# Patient Record
Sex: Female | Born: 1940 | Race: Black or African American | Hispanic: No | Smoking: Never smoker
Health system: Southern US, Community
[De-identification: ages and names within clinical notes are randomized; demographics above are authoritative.]

## PROBLEM LIST (undated history)

## (undated) DIAGNOSIS — M8580 Other specified disorders of bone density and structure, unspecified site: Secondary | ICD-10-CM

## (undated) DIAGNOSIS — E785 Hyperlipidemia, unspecified: Secondary | ICD-10-CM

## (undated) DIAGNOSIS — R0600 Dyspnea, unspecified: Secondary | ICD-10-CM

## (undated) DIAGNOSIS — R0609 Other forms of dyspnea: Secondary | ICD-10-CM

## (undated) DIAGNOSIS — I10 Essential (primary) hypertension: Secondary | ICD-10-CM

## (undated) DIAGNOSIS — K219 Gastro-esophageal reflux disease without esophagitis: Secondary | ICD-10-CM

## (undated) HISTORY — DX: Other specified disorders of bone density and structure, unspecified site: M85.80

## (undated) HISTORY — DX: Hyperlipidemia, unspecified: E78.5

## (undated) HISTORY — PX: CATARACT EXTRACTION: SUR2

## (undated) HISTORY — PX: OTHER SURGICAL HISTORY: SHX169

## (undated) HISTORY — DX: Dyspnea, unspecified: R06.00

## (undated) HISTORY — PX: TONSILLECTOMY: SUR1361

## (undated) HISTORY — PX: CHOLECYSTECTOMY: SHX55

## (undated) HISTORY — DX: Gastro-esophageal reflux disease without esophagitis: K21.9

## (undated) HISTORY — DX: Other forms of dyspnea: R06.09

## (undated) HISTORY — PX: EYE SURGERY: SHX253

---

## 1997-01-25 HISTORY — PX: OTHER SURGICAL HISTORY: SHX169

## 2000-12-07 ENCOUNTER — Other Ambulatory Visit: Admission: RE | Admit: 2000-12-07 | Discharge: 2000-12-07 | Payer: Self-pay | Admitting: General Surgery

## 2000-12-13 ENCOUNTER — Encounter: Payer: Self-pay | Admitting: General Surgery

## 2000-12-13 ENCOUNTER — Ambulatory Visit (HOSPITAL_COMMUNITY): Admission: RE | Admit: 2000-12-13 | Discharge: 2000-12-13 | Payer: Self-pay | Admitting: General Surgery

## 2001-06-01 ENCOUNTER — Observation Stay (HOSPITAL_COMMUNITY): Admission: EM | Admit: 2001-06-01 | Discharge: 2001-06-02 | Payer: Self-pay | Admitting: Emergency Medicine

## 2001-06-01 ENCOUNTER — Encounter: Payer: Self-pay | Admitting: Cardiothoracic Surgery

## 2001-06-01 ENCOUNTER — Encounter: Payer: Self-pay | Admitting: Internal Medicine

## 2001-06-05 ENCOUNTER — Ambulatory Visit (HOSPITAL_COMMUNITY): Admission: RE | Admit: 2001-06-05 | Discharge: 2001-06-05 | Payer: Self-pay | Admitting: Orthopedic Surgery

## 2005-04-06 ENCOUNTER — Ambulatory Visit: Payer: Self-pay | Admitting: Internal Medicine

## 2005-04-09 ENCOUNTER — Ambulatory Visit (HOSPITAL_COMMUNITY): Admission: RE | Admit: 2005-04-09 | Discharge: 2005-04-09 | Payer: Self-pay | Admitting: Internal Medicine

## 2005-05-20 ENCOUNTER — Ambulatory Visit (HOSPITAL_COMMUNITY): Admission: RE | Admit: 2005-05-20 | Discharge: 2005-05-20 | Payer: Self-pay | Admitting: Obstetrics and Gynecology

## 2005-05-20 ENCOUNTER — Encounter (INDEPENDENT_AMBULATORY_CARE_PROVIDER_SITE_OTHER): Payer: Self-pay | Admitting: *Deleted

## 2006-03-01 ENCOUNTER — Encounter: Payer: Self-pay | Admitting: Internal Medicine

## 2009-03-13 ENCOUNTER — Ambulatory Visit: Payer: Self-pay | Admitting: Family Medicine

## 2009-03-13 ENCOUNTER — Encounter: Payer: Self-pay | Admitting: Physician Assistant

## 2009-03-13 DIAGNOSIS — B351 Tinea unguium: Secondary | ICD-10-CM

## 2009-03-13 DIAGNOSIS — H6120 Impacted cerumen, unspecified ear: Secondary | ICD-10-CM

## 2009-03-13 DIAGNOSIS — D1739 Benign lipomatous neoplasm of skin and subcutaneous tissue of other sites: Secondary | ICD-10-CM | POA: Insufficient documentation

## 2009-03-13 DIAGNOSIS — R0989 Other specified symptoms and signs involving the circulatory and respiratory systems: Secondary | ICD-10-CM

## 2009-03-13 DIAGNOSIS — R0609 Other forms of dyspnea: Secondary | ICD-10-CM

## 2009-03-21 ENCOUNTER — Encounter (INDEPENDENT_AMBULATORY_CARE_PROVIDER_SITE_OTHER): Payer: Self-pay | Admitting: *Deleted

## 2009-03-21 ENCOUNTER — Encounter: Payer: Self-pay | Admitting: Physician Assistant

## 2009-03-21 LAB — CONVERTED CEMR LAB
ALT: 18 units/L
AST: 14 units/L
Alkaline Phosphatase: 80 units/L
CO2: 25 meq/L
Creatinine, Ser: 0.91 mg/dL
LDL Cholesterol: 154 mg/dL
Sodium: 143 meq/L
TSH: 2.003 microintl units/mL
Total Protein: 7.6 g/dL
Triglycerides: 136 mg/dL

## 2009-03-24 LAB — CONVERTED CEMR LAB
ALT: 18 units/L (ref 0–35)
AST: 14 units/L (ref 0–37)
BUN: 14 mg/dL (ref 6–23)
CO2: 25 meq/L (ref 19–32)
Calcium: 9.3 mg/dL (ref 8.4–10.5)
Chloride: 105 meq/L (ref 96–112)
Cholesterol: 233 mg/dL — ABNORMAL HIGH (ref 0–200)
HCT: 41.3 % (ref 36.0–46.0)
Hemoglobin: 13.4 g/dL (ref 12.0–15.0)
LDL Cholesterol: 154 mg/dL — ABNORMAL HIGH (ref 0–99)
MCHC: 32.4 g/dL (ref 30.0–36.0)
MCV: 87.5 fL (ref 78.0–100.0)
Platelets: 463 10*3/uL — ABNORMAL HIGH (ref 150–400)
Potassium: 4 meq/L (ref 3.5–5.3)
RBC: 4.72 M/uL (ref 3.87–5.11)
RDW: 13.8 % (ref 11.5–15.5)
Sodium: 143 meq/L (ref 135–145)
Total Protein: 7.6 g/dL (ref 6.0–8.3)
VLDL: 27 mg/dL (ref 0–40)

## 2009-03-27 ENCOUNTER — Ambulatory Visit: Payer: Self-pay | Admitting: Family Medicine

## 2009-03-28 ENCOUNTER — Encounter: Payer: Self-pay | Admitting: Family Medicine

## 2009-04-04 ENCOUNTER — Encounter (INDEPENDENT_AMBULATORY_CARE_PROVIDER_SITE_OTHER): Payer: Self-pay | Admitting: *Deleted

## 2009-04-08 ENCOUNTER — Encounter (INDEPENDENT_AMBULATORY_CARE_PROVIDER_SITE_OTHER): Payer: Self-pay | Admitting: *Deleted

## 2009-04-08 ENCOUNTER — Ambulatory Visit: Payer: Self-pay | Admitting: Cardiology

## 2009-04-08 DIAGNOSIS — E785 Hyperlipidemia, unspecified: Secondary | ICD-10-CM | POA: Insufficient documentation

## 2009-04-10 ENCOUNTER — Encounter: Payer: Self-pay | Admitting: Physician Assistant

## 2009-04-10 ENCOUNTER — Other Ambulatory Visit: Admission: RE | Admit: 2009-04-10 | Discharge: 2009-04-10 | Payer: Self-pay | Admitting: Family Medicine

## 2009-04-10 ENCOUNTER — Ambulatory Visit: Payer: Self-pay | Admitting: Family Medicine

## 2009-04-10 DIAGNOSIS — R030 Elevated blood-pressure reading, without diagnosis of hypertension: Secondary | ICD-10-CM

## 2009-04-10 DIAGNOSIS — R7301 Impaired fasting glucose: Secondary | ICD-10-CM

## 2009-04-10 DIAGNOSIS — B373 Candidiasis of vulva and vagina: Secondary | ICD-10-CM | POA: Insufficient documentation

## 2009-04-10 DIAGNOSIS — E559 Vitamin D deficiency, unspecified: Secondary | ICD-10-CM

## 2009-04-11 ENCOUNTER — Encounter: Payer: Self-pay | Admitting: Physician Assistant

## 2009-04-11 LAB — CONVERTED CEMR LAB
Candida species: POSITIVE — AB
Gardnerella vaginalis: NEGATIVE

## 2009-04-14 ENCOUNTER — Encounter: Payer: Self-pay | Admitting: Physician Assistant

## 2009-04-15 ENCOUNTER — Ambulatory Visit (HOSPITAL_COMMUNITY): Admission: RE | Admit: 2009-04-15 | Discharge: 2009-04-15 | Payer: Self-pay | Admitting: Family Medicine

## 2009-04-16 ENCOUNTER — Ambulatory Visit (HOSPITAL_COMMUNITY): Admission: RE | Admit: 2009-04-16 | Discharge: 2009-04-16 | Payer: Self-pay | Admitting: Family Medicine

## 2009-04-18 ENCOUNTER — Ambulatory Visit (HOSPITAL_COMMUNITY): Admission: RE | Admit: 2009-04-18 | Discharge: 2009-04-18 | Payer: Self-pay | Admitting: Cardiology

## 2009-04-18 ENCOUNTER — Encounter: Payer: Self-pay | Admitting: Cardiology

## 2009-05-06 ENCOUNTER — Ambulatory Visit: Payer: Self-pay | Admitting: Internal Medicine

## 2009-05-07 ENCOUNTER — Encounter: Payer: Self-pay | Admitting: Internal Medicine

## 2009-05-28 ENCOUNTER — Ambulatory Visit: Payer: Self-pay | Admitting: Internal Medicine

## 2009-05-28 ENCOUNTER — Ambulatory Visit (HOSPITAL_COMMUNITY): Admission: RE | Admit: 2009-05-28 | Discharge: 2009-05-28 | Payer: Self-pay | Admitting: Internal Medicine

## 2009-07-10 LAB — CONVERTED CEMR LAB
Cholesterol: 233 mg/dL — ABNORMAL HIGH (ref 0–200)
Glucose, Bld: 96 mg/dL (ref 70–99)
HDL: 52 mg/dL (ref >39)
Hgb A1c MFr Bld: 5.5 % (ref <5.7)
LDL Cholesterol: 156 mg/dL — ABNORMAL HIGH (ref 0–99)
Triglycerides: 127 mg/dL (ref <150)
Vit D, 25-Hydroxy: 25 ng/mL — ABNORMAL LOW (ref 30–89)

## 2009-07-11 ENCOUNTER — Ambulatory Visit: Payer: Self-pay | Admitting: Family Medicine

## 2009-07-11 DIAGNOSIS — E669 Obesity, unspecified: Secondary | ICD-10-CM | POA: Insufficient documentation

## 2009-07-11 DIAGNOSIS — M949 Disorder of cartilage, unspecified: Secondary | ICD-10-CM

## 2009-07-11 DIAGNOSIS — M899 Disorder of bone, unspecified: Secondary | ICD-10-CM | POA: Insufficient documentation

## 2009-12-12 ENCOUNTER — Ambulatory Visit: Payer: Self-pay | Admitting: Family Medicine

## 2010-01-01 ENCOUNTER — Encounter: Payer: Self-pay | Admitting: Family Medicine

## 2010-01-01 LAB — CONVERTED CEMR LAB
Cholesterol: 221 mg/dL — ABNORMAL HIGH (ref 0–200)
LDL Cholesterol: 150 mg/dL — ABNORMAL HIGH (ref 0–99)
Total CHOL/HDL Ratio: 4.2
Triglycerides: 91 mg/dL (ref <150)
VLDL: 18 mg/dL (ref 0–40)

## 2010-02-15 ENCOUNTER — Encounter: Payer: Self-pay | Admitting: Internal Medicine
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
## 2010-02-24 NOTE — Assessment & Plan Note (Signed)
Summary: new patient   Vital Signs:  Patient profile:   70 year old female Height:      64 inches Weight:      197.25 pounds BMI:     33.98 O2 Sat:      96 % on Room air Pulse rate:   76 / minute Resp:     16 per minute BP sitting:   142 / 83  (left arm)  Vitals Entered By: Adella Hare LPN (March 13, 2009 1:11 PM)  Serial Vital Signs/Assessments:  Time      Position  BP       Pulse  Resp  Temp     By                     139/87                         Adella Hare LPN  CC: new patient Is Patient Diabetic? No Pain Assessment Patient in pain? no        CC:  new patient.  History of Present Illness: New pt presents today to establish with PCP.   She has 2 concerns.   First is that she has noticed in the last 6 mos that she has shortness of breath with exertion off & on.  This is sometimes even with just walking a few feet in her home.  None currently. She denies any chest pain/pressure.  No hx of cardiac,or resp problems.  Pts 2nd concern is the 2 fingernails on her Rt hand.  She has tried lotions & OTC prods w/o improvement.  This is not causing her any pain.  Pt states it has been yrs since she has had a physical.  That she needs a pap & mamm.   It has been 2-3 yrs since she has had routine labs.  She denies having a pneumovax, & is uncertain of her last tetnus but states she hasn't had one in the last few yrs.  Current Medications (verified): 1)  None  Allergies (verified): No Known Drug Allergies  Past History:  Past medical, surgical, family and social histories (including risk factors) reviewed, and no changes noted (except as noted below).  Past Medical History: GERD - pt denies hx of this 03/13/09  Past Surgical History: Reviewed history from 03/01/2006 and no changes required. Caesarean section Cholecystectomy  Family History: Reviewed history and no changes required. mother deceased- heart attack, asthma father deceased- heart attack 6 sisters-  presumed healthy, one DM 2 brothers- presumed healthy  Social History: Reviewed history and no changes required. Retired Married 48 yr 7 grown children Never Smoked Alcohol use-no Drug use-no Regular exercise-no Smoking Status:  never Drug Use:  no Does Patient Exercise:  no  Review of Systems       Pt states she has gained wt since she quit working. General:  Denies chills, fatigue, fever, and loss of appetite. ENT:  Denies difficulty swallowing, earache, nasal congestion, postnasal drainage, and sore throat. CV:  Complains of shortness of breath with exertion; denies chest pain or discomfort, difficulty breathing at night, difficulty breathing while lying down, lightheadness, and palpitations. Resp:  Denies cough. GI:  Denies abdominal pain, constipation, diarrhea, indigestion, nausea, and vomiting. Heme:  Denies enlarge lymph nodes.  Physical Exam  General:  Well-developed,well-nourished,in no acute distress; alert,appropriate and cooperative throughout examination Eyes:  No corneal or conjunctival inflammation noted. EOMI. Perrla. Funduscopic exam benign, without hemorrhages,  exudates or papilledema. Vision grossly normal. Ears:  no external deformities, R cerumen impaction, and L Cerumen impaction.   Nose:  External nasal examination shows no deformity or inflammation. Nasal mucosa are pink and moist without lesions or exudates. Mouth:  Oral mucosa and oropharynx without lesions or exudates.  Upper plate noted. Neck:  No deformities, masses, or tenderness noted. Lungs:  Normal respiratory effort, chest expands symmetrically. Lungs are clear to auscultation, no crackles or wheezes. Heart:  Normal rate and regular rhythm. S1 and S2 normal without gallop, murmur, click, rub or other extra sounds. Abdomen:  Bowel sounds positive,abdomen soft and non-tender without masses, organomegaly or hernias noted. Extremities:  No clubbing, cyanosis, edema, or deformity noted with normal  full range of motion of all joints.   Skin:  Pt has a large superficial lipoma Rt upper back.  Rt 4th & 5 th fingernails noted to be irrig/thickened & discolored. Cervical Nodes:  No lymphadenopathy noted Psych:  Cognition and judgment appear intact. Alert and cooperative with normal attention span and concentration. No apparent delusions, illusions, hallucinations   Impression & Recommendations:  Problem # 1:  DYSPNEA ON EXERTION (ICD-786.09) Assessment Unchanged  Orders: T-CBC No Diff ([id number]) T-Comprehensive Metabolic Panel ([id number]) T-Lipid Profile [id number]) T-TSH [phone]) T-Vitamin D (25-Hydroxy) ([id number] Orders: Cardiology Referral (Cardiology) ... 03/14/2009  Pt was advised to avoid any exercise until see's the cardiologist and her heart is cleared.   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   Her updated medication list for this problem includes:    Penlac 8 % Soln (Ciclopirox) .Marland Kitchen... 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  She wishes to monitor for change in size.  If increases in size will then accept referral to surgeon.  Complete Medication List: 1)  Penlac 8 % Soln (Ciclopirox) .... Apply to affected nails once daily as directd  Other Orders: Tdap => 93yrs IM (55732) Pneumococcal Vaccine (20254) Admin 1st Vaccine (27062) Admin of Any Addtl Vaccine (37628) Admin 1st Vaccine (State) [phone]) Admin of Any Addtl Vaccine (State) [phone])  Patient Instructions: 1)  Please schedule a follow-up appointment in 1 month for pap/pelvic/breast exam. 2)  Labs ordered.  Please have done fasting before next appt.  3)  You need  to lose weight. Consider a lower calorie diet and regular exercise.  Prescriptions: PENLAC 8 % SOLN (CICLOPIROX) apply to affected nails once daily as directd  #1 bottle x 1   Entered and Authorized by:   Esperanza Sheets PA   Signed by:   Adella Hare LPN on 10/62/6948   Method used:   Electronically to        [facility] (retail)       [address]       [place], [place]  [address]       Ph: [phone]       Fax: [phone]   RxID:   [id number]    Tetanus/Td Vaccine    Vaccine Type: Tdap    Site: left deltoid    Mfr: Sanofi Pasteur    Dose: 0.5 ml    Route: IM    Given by: Adella Hare LPN    Exp.  Date: 02/22/2011    Lot #: [id number]    VIS given: 12/13/06 version given March 13, 2009.  Pneumovax Vaccine    Vaccine Type: Pneumovax    Site: right deltoid    Mfr: Merck    Dose: 0.5 ml    Route: IM    Given by: Adella Hare LPN    Exp. Date: 01/09/2010    Lot #: [id number]    VIS given: 08/23/95 version given March 13, 2009.   Appended Document: new patient I would have done an office ekf since dyspnea alone may be asymptom of coronary artery disease, the card appt is in 3 weeks, if the ekg was abn then the process could be speeded up,

## 2010-02-24 NOTE — Assessment & Plan Note (Signed)
Summary: follow up- room 1   Vital Signs:  Patient profile:   70 year old female Height:      64 inches Weight:      189.25 pounds BMI:     32.60 O2 Sat:      96 % on Room air Pulse rate:   85 / minute Resp:     16 per minute BP sitting:   100 / 84  (left arm)  Vitals Entered By: Adella Hare LPN (July 11, 2009 10:53 AM)  Nutrition Counseling: Patient's BMI is greater than 25 and therefore counseled on weight management options. CC: follow-up visit Is Patient Diabetic? No Pain Assessment Patient in pain? no        Referring Provider:  Lodema Hong Primary Provider:  Lodema Hong  CC:  follow-up visit.  History of Present Illness: Pt is here today for check up. She recently had labs done.  She has been to cardiologist and had neg work up for her exertional dyspnea.  It was felt that her syptoms were due to deconditioning.  she has started walking for exercise.  She is working on her diet but still has room for improvement. she is very excited about her wt loss.  Hx of Vit D def.  Took Vit D for a couple of mos but has run out.   She did have her colonoscopy and bone density test.  She has been prev noted of results. She has had an elevated BP in recent past.  No htn dx.  Is not on any meds.      Current Medications (verified): 1)  Penlac 8 % Soln (Ciclopirox) .... Apply To Affected Nails Once Daily As Directd 2)  Vitamin E 400 Unit Caps (Vitamin E) .... Take 1 Tab Daily 3)  Hair/skin/nails  Tabs (Multiple Vitamins-Minerals) .... Take 1 Tab Daily  Allergies (verified): No Known Drug Allergies  Past History:  Past medical history reviewed for relevance to current acute and chronic problems.  Past Medical History: Exertional dyspnea- deconditioning, neg cardiac eval Gastroesophageal reflux disease Hyperlipidemia Vitamin D Deficiency Osteopenia  Review of Systems General:  Denies chills and fever. ENT:  Denies ear discharge, nasal congestion, and sore throat. CV:   Denies chest pain or discomfort and palpitations. Resp:  Denies cough and shortness of breath. GI:  Denies abdominal pain, change in bowel habits, indigestion, nausea, and vomiting.  Physical Exam  General:  Well-developed,well-nourished,in no acute distress; alert,appropriate and cooperative throughout examination Head:  Normocephalic and atraumatic without obvious abnormalities. No apparent alopecia or balding. Ears:  External ear exam shows no significant lesions or deformities.  Otoscopic examination reveals clear canals, tympanic membranes are intact bilaterally without bulging, retraction, inflammation or discharge. Hearing is grossly normal bilaterally. Nose:  External nasal examination shows no deformity or inflammation. Nasal mucosa are pink and moist without lesions or exudates. Mouth:  Oral mucosa and oropharynx without lesions or exudates.   Neck:  No deformities, masses, or tenderness noted. Lungs:  Normal respiratory effort, chest expands symmetrically. Lungs are clear to auscultation, no crackles or wheezes. Heart:  Normal rate and regular rhythm. S1 and S2 normal without gallop, murmur, click, rub or other extra sounds. Skin:  oncyomycosis R hand nails improved.  new nl growth of nails noted, with thickened yellowish irrg nails at distal tips only. Cervical Nodes:  No lymphadenopathy noted Psych:  Cognition and judgment appear intact. Alert and cooperative with normal attention span and concentration. No apparent delusions, illusions, hallucinations   Impression &  Recommendations:  Problem # 1:  HYPERLIPIDEMIA (ICD-272.4) Assessment Unchanged  Discussed with pt that her cholesterol levels have not changed in the last 3 mos with her diet changes.  Pt admits that she has not made the improvements that she should and is not following the chol diet information.  She declined prescription cholesterol medication today and really wants to work harder on her diet and wt loss.  Will  recheck labs again in 3 mos.  Pt agrees if not at goal she will begin chol meds.  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  Continue Vit D 50000 international units & recheck levels in 3 mos.  Orders: T-Vitamin D (25-Hydroxy) [id number])  Problem # 3:  ELEVATED BLOOD PRESSURE (ICD-796.2) Assessment: Improved  BP today: 100/84 Prior BP: 118/88 (05/06/2009)  Labs Reviewed: Creat: 0.91 (03/21/2009) Chol: 233 (07/09/2009)   HDL: 52 (07/09/2009)   LDL: 156 (07/09/2009)   TG: 127 (07/09/2009)  Instructed in low sodium diet (DASH Handout) and behavior modification.    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 Apply to affected nails once daily as directd  Complete Medication List: 1)  Penlac 8 % Soln (Ciclopirox) .... Apply to affected nails once daily as directd 2)  Vitamin E 400 Unit Caps (Vitamin e) .... Take 1 tab daily 3)  Hair/skin/nails Tabs (Multiple vitamins-minerals) .... Take 1 tab daily 4)  Vitamin D (ergocalciferol) 50000 Unit Caps (Ergocalciferol) .... Take 1 weekly  Patient Instructions: 1)  Please schedule a follow-up appointment in 4 months. 2)  Have blood work done in 3 mos fasting to recheck your cholesterol levels and Vit D level. 3)  CONGATULATIONS ON YOUR WEIGHT LOSS!  KEEP UP THE GOOD WORK! 4)  Follow the cholesterol diet information you have at home from your previous visit. 5)  It is  important that you exercise regularly at least 20 minutes 5 times a week. If you develop chest pain, have severe difficulty breathing, or feel very tired , stop exercising immediately and seek medical attention. 6)  You need to lose weight. Consider a lower calorie diet and regular exercise.  Prescriptions: VITAMIN D (ERGOCALCIFEROL) 50000 UNIT CAPS (ERGOCALCIFEROL) take 1 weekly  #4 x 3   Entered and Authorized by:   Esperanza Sheets PA   Signed by:   Esperanza Sheets PA on 07/11/2009   Method used:   Electronically to        [facility] (retail)       [address]       [place], [place]  [address]       Ph: [phone]       Fax: [phone]   RxID:   [id number] PENLAC 8 % SOLN (CICLOPIROX) apply to affected nails once daily as directd  #1 bottle x 1   Entered and Authorized by:   Esperanza Sheets PA   Signed by:   Esperanza Sheets PA on 07/11/2009  Method used:   Electronically to        [facility] (retail)       [address]       [place], [place]  [address]       Ph: [phone]       Fax: [phone]   RxID:   [id number]

## 2010-02-24 NOTE — Assessment & Plan Note (Signed)
Summary: F UP   Vital Signs:  Patient profile:   70 year old female Height:      64 inches Weight:      190 pounds BMI:     32.73 O2 Sat:      99 % on Room air Pulse rate:   73 / minute Pulse rhythm:   regular Resp:     16 per minute BP sitting:   120 / 80  (left arm)  Vitals Entered By: Adella Hare LPN (December 12, 2009 9:45 AM)  Nutrition Counseling: Patient's BMI is greater than 25 and therefore counseled on weight management options.  O2 Flow:  Room air CC: follow-up visit Is Patient Diabetic? No Pain Assessment Patient in pain? no        Primary Care Provider:  Lodema Hong  CC:  follow-up visit.  History of Present Illness: Reports  that she has been doing fairly well. Denies recent fever or chills. Denies sinus pressure, nasal congestion , ear pain or sore throat. Denies chest congestion, or cough productive of sputum. Denies chest pain, palpitations, PND, orthopnea or leg swelling. Denies abdominal pain, nausea, vomitting, diarrhea or constipation. Denies change in bowel movements or bloody stool. Denies dysuria , frequency, incontinence or hesitancy. Denies  joint pain, swelling, or reduced mobility. Denies headaches, vertigo, seizures. Denies depression, anxiety or insomnia. Denies  rash, lesions, or itch. States she is goingto work hard on dietary change so that she can lose weight      Current Medications (verified): 1)  Penlac 8 % Soln (Ciclopirox) .... Apply To Affected Nails Once Daily As Directd 2)  Vitamin E 400 Unit Caps (Vitamin E) .... Take 1 Tab Daily 3)  Hair/skin/nails  Tabs (Multiple Vitamins-Minerals) .... Take 1 Tab Daily 4)  Vitamin D (Ergocalciferol) 50000 Unit Caps (Ergocalciferol) .... Take 1 Weekly 5)  Calcium-Vitamin D 250-125 Mg-Unit Tabs (Calcium Carbonate-Vitamin D) .... One Tab By Mouth Once Daily  Allergies (verified): No Known Drug Allergies  Review of Systems      See HPI General:  Complains of sweats. Eyes:  Denies  discharge and red eye. Endo:  Denies cold intolerance, excessive hunger, excessive thirst, and excessive urination. Heme:  Denies abnormal bruising and bleeding. Allergy:  Complains of seasonal allergies; denies hives or rash and itching eyes.  Physical Exam  General:  Well-developed,well-nourished,in no acute distress; alert,appropriate and cooperative throughout examination HEENT: No facial asymmetry,  EOMI, No sinus tenderness, TM's Clear, oropharynx  pink and moist.   Chest: Clear to auscultation bilaterally.  CVS: S1, S2, No murmurs, No S3.   Abd: Soft, Nontender.  MS: Adequate ROM spine, hips, shoulders and knees.  Ext: No edema.   CNS: CN 2-12 intact, power tone and sensation normal throughout.   Skin: Intact, no visible lesions or rashes.  Psych: Good eye contact, normal affect.  Memory intact, not anxious or depressed appearing.    Impression & Recommendations:  Problem # 1:  OBESITY (ICD-278.00) Assessment Deteriorated  Ht: 64 (12/12/2009)   Wt: 190 (12/12/2009)   BMI: 32.73 (12/12/2009) therapeutic lifestyle change discussed and encouraged  Problem # 2:  OSTEOPENIA (ICD-733.90) Assessment: Comment Only impt of calcium and vit  supplementation stressed also reg exercise  Problem # 3:  ELEVATED BLOOD PRESSURE (ICD-796.2) Assessment: Unchanged  BP today: 120/80 Prior BP: 100/84 (07/11/2009)  Labs Reviewed: Creat: 0.91 (03/21/2009) Chol: 233 (07/09/2009)   HDL: 52 (07/09/2009)   LDL: 156 (07/09/2009)   TG: 127 (07/09/2009)  Instructed in low sodium diet (  DASH Handout) and behavior modification.    Problem # 4:  HYPERLIPIDEMIA (ICD-272.4) Assessment: Comment Only  Orders: T-Lipid Profile [id number])  Labs Reviewed: SGOT: 14 (03/21/2009)   SGPT: 18 (03/21/2009)   HDL:52 (07/09/2009), 52 (03/21/2009)  LDL:156 (07/09/2009), 154 (03/21/2009)  Chol:233 (07/09/2009), 233 (03/21/2009)  Trig:127 (07/09/2009), 136 (03/21/2009) Low fat dietdiscussed and  encouraged  Problem # 5:  VITAMIN D DEFICIENCY (ICD-268.9) Assessment: Comment Only  Orders: Medicare Electronic Prescription [phone]) T-Vitamin D (25-Hydroxy) ([id number])  Complete Medication List: 1)  Penlac 8 % Soln (Ciclopirox) .... Apply to affected nails once daily as directd 2)  Vitamin E 400 Unit Caps (Vitamin e) .... Take 1 tab daily 3)  Hair/skin/nails Tabs (Multiple vitamins-minerals) .... Take 1 tab daily 4)  Vitamin D (ergocalciferol) 50000 Unit Caps (Ergocalciferol) .... Take 1 weekly 5)  Calcium-vitamin D 250-125 Mg-unit Tabs (Calcium carbonate-vitamin d) .... One tab by mouth once daily 6)  Aspirin 81 Mg Tbec (Aspirin) .... Take 1 tablet by mouth once a day  Patient Instructions: 1)  Please schedule a follow-up appointment in 3.5  months.(end Feb) 2)  It is important that you exercise regularly at least  3)  320 minutes 5 times a week. If you develop chest pain, have severe difficulty breathing, or feel very tired , stop exercising immediately and seek medical attention.increase fresh fruit and veg and water 4)  You need to lose weight. Consider a lower calorie diet and regular exercise.  5)  Lipid Panel prior to visit, ICD-9:  and Vit d  in 3.5 monhgs  6)  PLS take your calcium every day for your bones 7)  pls start aspirin 81mg  daily 8)  A flu vac is recommended, call if you decide Prescriptions: VITAMIN D (ERGOCALCIFEROL) 50000 UNIT CAPS (ERGOCALCIFEROL) take 1 weekly  #4 x 3   Entered by:   Adella Hare LPN   Authorized by:   Syliva Overman MD   Signed by:   Adella Hare LPN on 24/40/1027   Method used:   Electronically to        [facility] (retail)       [address] [place], [place]  [address]       Ph: [phone]       Fax: [phone]   RxID:   [id number]    Orders Added: 1)  Est. Patient Level IV [01093] 2)  Medicare Electronic Prescription [G8553] 3)  T-Lipid Profile [[id number]] 4)   T-Vitamin D (25-Hydroxy) [phone]

## 2010-02-24 NOTE — Letter (Signed)
Summary: TCS ORDER  TCS ORDER   Imported By: Ave Filter 05/07/2009 11:49:29  _____________________________________________________________________  External Attachment:    Type:   Image     Comment:   External Document
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
## 2010-02-24 NOTE — Medication Information (Signed)
Summary: Tax adviser   Imported By: Lind Guest 04/11/2009 08:18:26  _____________________________________________________________________  External Attachment:    Type:   Image     Comment:   External Document

## 2010-02-24 NOTE — Assessment & Plan Note (Signed)
Summary: ekg  Nurse Visit   Vital Signs:  Patient profile:   70 year old female Weight:      198.75 pounds BP sitting:   130 / 90  (left arm)  Vitals Entered By: Adella Hare LPN (March 27, 452 11:50 AM)  Allergies: No Known Drug Allergies  Orders Added: 1)  EKG w/ Interpretation [93000]

## 2010-02-24 NOTE — Letter (Signed)
Summary: Laboratory/X-Ray Results  [facility]  [address]   [place], [place] [address]   Phone: [phone]  Fax: [phone]    Lab/X-Ray Results  April 14, 2009  MRN: [id number]  Allison [name] [address] [place], [place]  [address]    The results of your recent lab/x-ray has been reviewed and were found:      Your pap smear was normal.    If you have any questions, please contact our office.     Esperanza Sheets PA

## 2010-02-24 NOTE — Letter (Signed)
Summary: Letter  Letter   Imported By: Lind Guest 01/01/2010 16:48:11  _____________________________________________________________________  External Attachment:    Type:   Image     Comment:   External Document

## 2010-02-24 NOTE — Miscellaneous (Signed)
Summary: labs cmp,lipids,03/21/2009  Clinical Lists Changes  Observations: Added new observation of CALCIUM: 9.3 mg/dL (16/10/9602 54:09) Added new observation of ALBUMIN: 4.2 g/dL (81/19/1478 29:56) Added new observation of PROTEIN, TOT: 7.6 g/dL (21/30/8657 84:69) Added new observation of SGPT (ALT): 18 units/L (03/21/2009 16:26) Added new observation of SGOT (AST): 14 units/L (03/21/2009 16:26) Added new observation of ALK PHOS: 80 units/L (03/21/2009 16:26) Added new observation of CREATININE: 0.91 mg/dL (62/95/2841 32:44) Added new observation of BUN: 14 mg/dL (01/27/7251 66:44) Added new observation of BG RANDOM: 101 mg/dL (03/47/4259 56:38) Added new observation of CO2 PLSM/SER: 25 meq/L (03/21/2009 16:26) Added new observation of CL SERUM: 105 meq/L (03/21/2009 16:26) Added new observation of K SERUM: 4.0 meq/L (03/21/2009 16:26) Added new observation of NA: 143 meq/L (03/21/2009 16:26) Added new observation of LDL: 154 mg/dL (75/64/3329 51:88) Added new observation of HDL: 52 mg/dL (41/66/0630 16:01) Added new observation of TRIGLYC TOT: 136 mg/dL (09/32/3557 32:20) Added new observation of CHOLESTEROL: 233 mg/dL (25/42/7062 37:62) Added new observation of TSH: 2.003 microintl units/mL (03/21/2009 16:26)
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
## 2010-02-24 NOTE — Letter (Signed)
Summary: Stress Echocardiogram Information Sheet  [facility]  [address], [place] [address]   Phone: [phone]  Fax: [phone]      April 08, 2009 MRN: [id number] light prior to the test.   Allison [name]  Doctor: Appointment Date: Appointment Time: Appointment Location: [facility]  Stress Echocardiogram Information Sheet    Instructions:   1. DO NOT  take your _________ medicine _______ days before the test.  2. NOTHING TO EAT OR DRINK AFTER MIDNIGHT  3. Dress prepared to exercise.  4. DO NOT use ANY caffine or tobacco products 3 hours before appointment.  5. Report to the Short Stay Center on the1st floor.  6. Please bring all current prescription medications.  7. If you have any questions, please call [phone]

## 2010-03-19 ENCOUNTER — Ambulatory Visit: Payer: Self-pay | Admitting: Family Medicine

## 2010-04-01 ENCOUNTER — Other Ambulatory Visit: Payer: Self-pay | Admitting: Family Medicine

## 2010-04-01 LAB — LIPID PANEL
Cholesterol: 228 mg/dL — ABNORMAL HIGH (ref 0–200)
Triglycerides: 81 mg/dL (ref <150)

## 2010-04-02 LAB — CONVERTED CEMR LAB
HDL: 57 mg/dL (ref >39)
Triglycerides: 81 mg/dL (ref <150)
VLDL: 16 mg/dL (ref 0–40)

## 2010-04-13 ENCOUNTER — Encounter: Payer: Self-pay | Admitting: Family Medicine

## 2010-04-15 ENCOUNTER — Ambulatory Visit (INDEPENDENT_AMBULATORY_CARE_PROVIDER_SITE_OTHER): Payer: MEDICARE | Admitting: Family Medicine

## 2010-04-15 ENCOUNTER — Encounter: Payer: Self-pay | Admitting: Family Medicine

## 2010-04-15 VITALS — BP 140/80 | HR 74 | Resp 16 | Ht 63.5 in | Wt 192.0 lb

## 2010-04-15 DIAGNOSIS — E559 Vitamin D deficiency, unspecified: Secondary | ICD-10-CM

## 2010-04-15 DIAGNOSIS — R030 Elevated blood-pressure reading, without diagnosis of hypertension: Secondary | ICD-10-CM

## 2010-04-15 DIAGNOSIS — E785 Hyperlipidemia, unspecified: Secondary | ICD-10-CM

## 2010-04-15 DIAGNOSIS — R5383 Other fatigue: Secondary | ICD-10-CM

## 2010-04-15 MED ORDER — ERGOCALCIFEROL 1.25 MG (50000 UT) PO CAPS: 50000.0000 [IU] | ORAL_CAPSULE | ORAL | Status: DC

## 2010-04-15 NOTE — Patient Instructions (Signed)
F/u in 5 months.  Congrats on weight loss, please keep it up.  Your cholesterol is still very high, you need to reduce your fried and fatty foods, red meats, cheese, butter, oil and whole milk (change to 1% or skimmed)  Pls continue your vitamin D capsules once weekly for the next 6 months, we will refill.  We will provide you with a 1500 cal diet sheet

## 2010-05-05 NOTE — Progress Notes (Signed)
  Subjective:    Patient ID: Allison [name], female    DOB: [birth year], 70 y.o.   MRN: [id number]  HPI The PT is here for follow up and re-evaluation of chronic medical conditions, medication management and review of recent lab and radiology data.  Preventive health is updated, specifically  Cancer screening, Osteoporosis screening and Immunization.   Questions or concerns regarding consultations or procedures which the PT has had in the interim are  addressed. The PT denies any adverse reactions to current medications since the last visit.  There are no new concerns.  There are no specific complaints  Ms. Deckman has worked diligently with change in her diet , and a commitment to regular physical activity , with excellent results as far as her weight is concerned. Her cholesterol however has not improved, and remains high    Review of Systems Denies recent fever or chills. Denies sinus pressure, nasal congestion, ear pain or sore throat. Denies chest congestion, productive cough or wheezing. Denies chest pains, palpitations, paroxysmal nocturnal dyspnea, orthopnea and leg swelling Denies abdominal pain, nausea, vomiting,diarrhea or constipation.  Denies rectal bleeding or change in bowel movement. Denies dysuria, frequency, hesitancy or incontinence. Denies joint pain, swelling and limitation and mobility. Denies headaches, seizure, numbness, or tingling. Denies depression, anxiety or insomnia. Denies skin break down or rash.        Objective:   Physical Exam    Patient alert and oriented and in no Cardiopulmonary distress.  HEENT: No facial asymmetry, EOMI, no sinus tenderness, TM's clear, Oropharynx pink and moist.  Neck supple no adenopathy.  Chest: Clear to auscultation bilaterally.  CVS: S1, S2 no murmurs, no S3.  ABD: Soft non tender. Bowel sounds normal.  Ext: No edema  MS: Adequate ROM spine, shoulders, hips and knees.  Skin: Intact, no ulcerations or rash  noted.  Psych: Good eye contact, normal affect. Memory intact not anxious or depressed appearing.  CNS: CN 2-12 intact, power, tone and sensation normal throughout.     Assessment & Plan:  1. Obesity: improved, pt commended on this , and encouraged to continue her weight loss program. 2.Hyperlipidemia: Hyperlipidemia:Low fat diet discussed and encouraged. Unchanged. 3.Elevated blood pressure: single episode, will follow closely , DASH diet discussed and info provided also 4. Routine health maintainnace : up to date.

## 2010-06-12 NOTE — Op Note (Signed)
NAME:  Allison [name], Allison [name]               ACCOUNT NO.:  [id number]   MEDICAL RECORD NO.:  [id number]          PATIENT TYPE:  AMB   LOCATION:  DAY                           FACILITY:  [facility]   PHYSICIAN:  Tilda Burrow, M.D. DATE OF BIRTH:  1940/12/20   DATE OF PROCEDURE:  05/20/2005  DATE OF DISCHARGE:                                 OPERATIVE REPORT   PREOPERATIVE DIAGNOSES:  1.  Postmenopausal bleeding.  2.  Endometrial polyp.  3.  Hydrosalpinx.  4.  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   POSTOPERATIVE DIAGNOSES:  1.  Postmenopausal bleeding.  2.  Endometrial polyp.  3.  Hydrosalpinx.  4.  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   OPERATION/PROCEDURE:  Hysteroscopy, dilatation and curettage.   SURGEON:  Tilda Burrow, M.D.   ASSISTANT:  Hart Rochester, CSC .   ANESTHESIA:  General.   COMPLICATIONS:  Brief episode of bronchospasm necessitating conversion from  laryngeal mask anesthesia to endotracheal intubation and administration of a  bronchodilator.   FINDINGS:  Uterus sounded to 11 cm, large fundal endometrial polyp fragile,  being sent for pathology confirmation.   DESCRIPTION OF PROCEDURE:  The patient was taken to the operating room and  prepped and draped for vaginal procedure with laryngeal mask anesthesia  induced effectively.  Cervix was grasped with single-tooth tenaculum and  dilated to 31-French after uterine sounding to 11 cm in the anteflexed  position.  The hysteroscope was easily introduced.  the cervical seal was  not complete so it did not interfere with the procedure and allowed egress  of fluid as desired.   Visualization showed a large irregular, somewhat ominous-appearing polyp  with a narrow stalk at the uterine fundus.  Operative hysteroscopy scissors  could be used to cut across approximately two-thirds of the base of the  stalk.  At this point in time the hysteroscope was removed and ring forceps  inserted and with some challenges  we were able to grasp and bring it out in  fragments.  During this portion of the case, the patient began to have some  bronchospasm and this necessitated interruption of the procedure long  enough for endotracheal intubation.  Stabilization with administration of  a  bronchodilator.  The patient was stable throughout the case thereafter.  We  then proceed with the hysteroscopic reinspection of the fundus.  A small  portion of the stalk remained and we were able to surgically resect it with  hysteroscope.  All the tissue specimens, quite large specimen of tissue,  approximately 3 cm x 2 cm to maximum diameters when reconstructed.  The  hysteroscopy confirmed that the uterus was clearly evacuated.  Further  documentation of the evacuated uterus was taken and the patient was allowed  to awaken and go to the recovery room in stable condition.   ESTIMATED BLOOD LOSS:  Minimal.      Tilda Burrow, M.D.  Electronically Signed     JVF/MEDQ  D:  05/20/2005  T:  05/21/2005  Job:  [id number]
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
## 2010-08-26 ENCOUNTER — Encounter (HOSPITAL_COMMUNITY): Payer: Self-pay | Admitting: *Deleted

## 2010-08-26 ENCOUNTER — Emergency Department (HOSPITAL_COMMUNITY): Payer: Medicare Other

## 2010-08-26 ENCOUNTER — Emergency Department (HOSPITAL_COMMUNITY)
Admission: EM | Admit: 2010-08-26 | Discharge: 2010-08-26 | Disposition: A | Discharge status: Home / Self Care | Payer: Medicare Other | Attending: Emergency Medicine | Admitting: Emergency Medicine

## 2010-08-26 DIAGNOSIS — S61259A Open bite of unspecified finger without damage to nail, initial encounter: Secondary | ICD-10-CM

## 2010-08-26 DIAGNOSIS — Z7982 Long term (current) use of aspirin: Secondary | ICD-10-CM | POA: Insufficient documentation

## 2010-08-26 DIAGNOSIS — T148 Other injury of unspecified body region: Secondary | ICD-10-CM

## 2010-08-26 DIAGNOSIS — E538 Deficiency of other specified B group vitamins: Secondary | ICD-10-CM | POA: Insufficient documentation

## 2010-08-26 DIAGNOSIS — H269 Unspecified cataract: Secondary | ICD-10-CM | POA: Insufficient documentation

## 2010-08-26 DIAGNOSIS — Y92009 Unspecified place in unspecified non-institutional (private) residence as the place of occurrence of the external cause: Secondary | ICD-10-CM | POA: Insufficient documentation

## 2010-08-26 DIAGNOSIS — W5581XA Bitten by other mammals, initial encounter: Secondary | ICD-10-CM

## 2010-08-26 DIAGNOSIS — W540XXA Bitten by dog, initial encounter: Secondary | ICD-10-CM | POA: Insufficient documentation

## 2010-08-26 DIAGNOSIS — S61209A Unspecified open wound of unspecified finger without damage to nail, initial encounter: Secondary | ICD-10-CM | POA: Insufficient documentation

## 2010-08-26 DIAGNOSIS — M899 Disorder of bone, unspecified: Secondary | ICD-10-CM | POA: Insufficient documentation

## 2010-08-26 MED ORDER — HYDROCODONE-ACETAMINOPHEN 5-325 MG PO TABS: 1.0000 | ORAL_TABLET | ORAL | Status: AC | PRN

## 2010-08-26 MED ORDER — AMOXICILLIN-POT CLAVULANATE 875-125 MG PO TABS
1.0000 | ORAL_TABLET | Freq: Once | ORAL | Status: AC
  Administered 2010-08-26: 1 via ORAL
  Filled 2010-08-26: qty 1

## 2010-08-26 MED ORDER — TETANUS-DIPHTH-ACELL PERTUSSIS 5-2.5-18.5 LF-MCG/0.5 IM SUSP
0.5000 mL | Freq: Once | INTRAMUSCULAR | Status: AC
  Administered 2010-08-26: 0.5 mL via INTRAMUSCULAR
  Filled 2010-08-26: qty 0.5

## 2010-08-26 MED ORDER — BUPIVACAINE HCL (PF) 0.5 % IJ SOLN: 10.0000 mL | Freq: Once | INTRAMUSCULAR | Status: DC

## 2010-08-26 MED ORDER — AMOXICILLIN-POT CLAVULANATE 875-125 MG PO TABS: 1.0000 | ORAL_TABLET | Freq: Two times a day (BID) | ORAL | Status: AC

## 2010-08-26 MED ORDER — BUPIVACAINE HCL (PF) 0.5 % IJ SOLN
10.0000 mL | Freq: Once | INTRAMUSCULAR | Status: AC
  Administered 2010-08-26: 10 mL
  Filled 2010-08-26: qty 30

## 2010-08-26 NOTE — ED Notes (Signed)
Dog bite to right middle finger, dog is pt's dog, up to date on all shots, pt has laceration to right middle finger, bleeding controlled at triage

## 2010-08-26 NOTE — ED Notes (Signed)
Reports was incidentally bitten by her own dog when trying to break up a fight between it and her neighbor's dog; approx 1.5 cm lac to tip of left middle finger; bleeding controlled; pt states her dog's vaccinations are UTD, and animal control officer has been in ED to interview pt re: dog bite.
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
## 2010-08-26 NOTE — ED Notes (Signed)
Steri strips in place to right middle finger; bleeding controlled; bandaids applied over wound site
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
## 2010-09-12 LAB — CBC WITH DIFFERENTIAL/PLATELET
Basophils Absolute: 0.1 10*3/uL (ref 0.0–0.1)
Eosinophils Absolute: 0.3 10*3/uL (ref 0.0–0.7)
HCT: 43.1 % (ref 36.0–46.0)
Hemoglobin: 14 g/dL (ref 12.0–15.0)
Lymphocytes Relative: 56 % — ABNORMAL HIGH (ref 12–46)
Lymphs Abs: 4.8 10*3/uL — ABNORMAL HIGH (ref 0.7–4.0)
MCH: 29 pg (ref 26.0–34.0)
MCHC: 32.5 g/dL (ref 30.0–36.0)
Monocytes Absolute: 0.5 10*3/uL (ref 0.1–1.0)
Neutro Abs: 3.1 10*3/uL (ref 1.7–7.7)
Neutrophils Relative %: 35 % — ABNORMAL LOW (ref 43–77)
Platelets: 388 10*3/uL (ref 150–400)
RBC: 4.83 MIL/uL (ref 3.87–5.11)

## 2010-09-12 LAB — TSH: TSH: 1.994 u[IU]/mL (ref 0.350–4.500)

## 2010-09-12 LAB — BASIC METABOLIC PANEL
CO2: 24 mEq/L (ref 19–32)
Calcium: 9.3 mg/dL (ref 8.4–10.5)

## 2010-09-12 LAB — LIPID PANEL
Total CHOL/HDL Ratio: 4 Ratio
VLDL: 19 mg/dL (ref 0–40)

## 2010-09-14 ENCOUNTER — Encounter: Payer: Self-pay | Admitting: Family Medicine

## 2010-09-15 ENCOUNTER — Ambulatory Visit: Payer: MEDICARE | Admitting: Family Medicine

## 2010-09-30 ENCOUNTER — Encounter: Payer: Self-pay | Admitting: Family Medicine

## 2010-10-01 ENCOUNTER — Encounter: Payer: Self-pay | Admitting: Family Medicine

## 2010-10-01 ENCOUNTER — Ambulatory Visit (INDEPENDENT_AMBULATORY_CARE_PROVIDER_SITE_OTHER): Payer: Medicare Other | Admitting: Family Medicine

## 2010-10-01 VITALS — BP 140/94 | HR 72 | Resp 16 | Ht 63.5 in | Wt 192.4 lb

## 2010-10-01 DIAGNOSIS — R7301 Impaired fasting glucose: Secondary | ICD-10-CM

## 2010-10-01 DIAGNOSIS — B351 Tinea unguium: Secondary | ICD-10-CM

## 2010-10-01 DIAGNOSIS — R030 Elevated blood-pressure reading, without diagnosis of hypertension: Secondary | ICD-10-CM

## 2010-10-01 DIAGNOSIS — E559 Vitamin D deficiency, unspecified: Secondary | ICD-10-CM

## 2010-10-01 DIAGNOSIS — E785 Hyperlipidemia, unspecified: Secondary | ICD-10-CM

## 2010-10-01 DIAGNOSIS — E669 Obesity, unspecified: Secondary | ICD-10-CM

## 2010-10-01 MED ORDER — AMLODIPINE BESYLATE 2.5 MG PO TABS: 2.5000 mg | ORAL_TABLET | Freq: Every day | ORAL | Status: DC

## 2010-10-01 MED ORDER — PRAVASTATIN SODIUM 20 MG PO TABS: 20.0000 mg | ORAL_TABLET | Freq: Every evening | ORAL | Status: DC

## 2010-10-01 MED ORDER — CICLOPIROX 8 % EX SOLN: Freq: Every day | CUTANEOUS | Status: DC

## 2010-10-01 MED ORDER — ERGOCALCIFEROL 1.25 MG (50000 UT) PO CAPS: 50000.0000 [IU] | ORAL_CAPSULE | ORAL | Status: DC

## 2010-10-01 NOTE — Patient Instructions (Signed)
F/u in 3 months.  Fasting lipid, hepatic, chem 7, HBA1C , vid T D  In 3 months  It is important that you exercise regularly at least 30 minutes 5 times a week. If you develop chest pain, have severe difficulty breathing, or feel very tired, stop exercising immediately and seek medical attention    A healthy diet is rich in fruit, vegetables and whole grains. Poultry fish, nuts and beans are a healthy choice for protein rather then red meat. A low sodium diet and drinking 64 ounces of water daily is generally recommended. Oils and sweet should be limited. Carbohydrates especially for those who are diabetic or overweight, should be limited to 34-45 gram per meal. It is important to eat on a regular schedule, at least 3 times daily. Snacks should be primarily fruits, vegetables or nuts.   You need to lose weight for improved health.  New meds for blood pressure and cholesterol, and also please take the vit D as prescribed

## 2010-10-04 NOTE — ED Provider Notes (Signed)
Medical screening examination/treatment/procedure(s) were performed by non-physician practitioner and as supervising physician I was immediately available for consultation/collaboration.  Doug Sou, MD 10/04/10 [phone]

## 2010-10-05 NOTE — Assessment & Plan Note (Signed)
Uncontrolled, low fat diet discussed and encouraged, pt to start medication also

## 2010-10-05 NOTE — Assessment & Plan Note (Signed)
Uncontrolled, pt to start daily medication, the importance of this is stressed, also recommitment to healthy lifestyle

## 2010-10-05 NOTE — Progress Notes (Signed)
  Subjective:    Patient ID: Allison [name], female    DOB: 1940/09/23, 70 y.o.   MRN: [id number]  HPI The PT is here for follow up and re-evaluation of chronic medical conditions, medication management and review of any available recent lab and radiology data.  Preventive health is updated, specifically  Cancer screening and Immunization.   Questions or concerns regarding consultations or procedures which the PT has had in the interim are  addressed. The PT denies any adverse reactions to current medications since the last visit.  There are no new concerns.  There are no specific complaints       Review of Systems Denies recent fever or chills. Denies sinus pressure, nasal congestion, ear pain or sore throat. Denies chest congestion, productive cough or wheezing. Denies chest pains, palpitations and leg swelling Denies abdominal pain, nausea, vomiting,diarrhea or constipation.   Denies dysuria, frequency, hesitancy or incontinence. Denies joint pain, swelling and limitation in mobility. Denies headaches, seizures, numbness, or tingling. Denies depression, anxiety or insomnia. Denies skin break down or rash.        Objective:   Physical Exam Patient alert and oriented and in no cardiopulmonary distress.  HEENT: No facial asymmetry, EOMI, no sinus tenderness,  oropharynx pink and moist.  Neck supple no adenopathy.  Chest: Clear to auscultation bilaterally.  CVS: S1, S2 no murmurs, no S3.  ABD: Soft non tender. Bowel sounds normal.  Ext: No edema  MS: Adequate ROM spine, shoulders, hips and knees.  Skin: Intact, no ulcerations or rash noted.  Psych: Good eye contact, normal affect. Memory intact not anxious or depressed appearing.  CNS: CN 2-12 intact, power, tone and sensation normal throughout.        Assessment & Plan:

## 2010-10-05 NOTE — Assessment & Plan Note (Signed)
Educated re the need to take med as prescribed for improved bone health

## 2010-10-05 NOTE — Assessment & Plan Note (Signed)
Deteriorated. Patient re-educated about  the importance of commitment to a  minimum of 150 minutes of exercise per week. The importance of healthy food choices with portion control discussed. Encouraged to start a food diary, count calories and to consider  joining a support group. Sample diet sheets offered. Goals set by the patient for the next several months.    

## 2010-10-05 NOTE — Assessment & Plan Note (Signed)
Improved, continue penlac

## 2010-12-31 ENCOUNTER — Encounter: Payer: Self-pay | Admitting: Family Medicine

## 2011-01-05 ENCOUNTER — Ambulatory Visit: Payer: Medicare Other | Admitting: Family Medicine

## 2011-02-05 LAB — HEMOGLOBIN A1C
Hgb A1c MFr Bld: 5.4 % (ref <5.7)
Mean Plasma Glucose: 108 mg/dL (ref <117)

## 2011-02-06 LAB — LIPID PANEL

## 2011-02-06 LAB — BASIC METABOLIC PANEL

## 2011-02-06 LAB — HEPATIC FUNCTION PANEL

## 2011-02-08 LAB — VITAMIN D 1,25 DIHYDROXY

## 2011-02-09 ENCOUNTER — Other Ambulatory Visit: Payer: Self-pay | Admitting: Family Medicine

## 2011-02-10 ENCOUNTER — Encounter: Payer: Self-pay | Admitting: Family Medicine

## 2011-02-10 LAB — HEPATIC FUNCTION PANEL
ALT: 13 U/L (ref 0–35)
AST: 15 U/L (ref 0–37)
Albumin: 4.3 g/dL (ref 3.5–5.2)
Alkaline Phosphatase: 72 U/L (ref 39–117)
Total Bilirubin: 0.9 mg/dL (ref 0.3–1.2)
Total Protein: 7.4 g/dL (ref 6.0–8.3)

## 2011-02-10 LAB — LIPID PANEL
Cholesterol: 218 mg/dL — ABNORMAL HIGH (ref 0–200)
HDL: 53 mg/dL (ref >39)
Total CHOL/HDL Ratio: 4.1 Ratio
Triglycerides: 121 mg/dL (ref <150)

## 2011-02-10 LAB — BASIC METABOLIC PANEL
CO2: 27 mEq/L (ref 19–32)
Calcium: 9.6 mg/dL (ref 8.4–10.5)
Creat: 0.81 mg/dL (ref 0.50–1.10)
Sodium: 141 mEq/L (ref 135–145)

## 2011-02-11 ENCOUNTER — Encounter: Payer: Self-pay | Admitting: Family Medicine

## 2011-02-11 ENCOUNTER — Ambulatory Visit (INDEPENDENT_AMBULATORY_CARE_PROVIDER_SITE_OTHER): Payer: Medicare Other | Admitting: Family Medicine

## 2011-02-11 VITALS — BP 128/82 | HR 83 | Resp 16 | Ht 63.5 in | Wt 188.4 lb

## 2011-02-11 DIAGNOSIS — R7301 Impaired fasting glucose: Secondary | ICD-10-CM

## 2011-02-11 DIAGNOSIS — E669 Obesity, unspecified: Secondary | ICD-10-CM

## 2011-02-11 DIAGNOSIS — E785 Hyperlipidemia, unspecified: Secondary | ICD-10-CM

## 2011-02-11 DIAGNOSIS — R030 Elevated blood-pressure reading, without diagnosis of hypertension: Secondary | ICD-10-CM

## 2011-02-11 DIAGNOSIS — E559 Vitamin D deficiency, unspecified: Secondary | ICD-10-CM

## 2011-02-11 NOTE — Assessment & Plan Note (Signed)
Controlled, no change in medication  

## 2011-02-11 NOTE — Assessment & Plan Note (Signed)
Unchanged due to medical and dietary non compliance

## 2011-02-11 NOTE — Patient Instructions (Addendum)
F/u in 5.5 months  Fasting lipid, and cmp in 5.5 months before visit.   Pls work on lifestyle changes discussed to improve your health, and cut out fried and fatty foods, your cholesterol is too high.  You need to take your medications as prescribed.  A healthy diet is rich in fruit, vegetables and whole grains. Poultry fish, nuts and beans are a healthy choice for protein rather then red meat. A low sodium diet and drinking 64 ounces of water daily is generally recommended. Oils and sweet should be limited. Carbohydrates especially for those who are diabetic or overweight, should be limited to 34-45 gram per meal. It is important to eat on a regular schedule, at least 3 times daily. Snacks should be primarily fruits, vegetables or nuts. It is important that you exercise regularly at least 30 minutes 5 times a week. If you develop chest pain, have severe difficulty breathing, or feel very tired, stop exercising immediately and seek medical attention    Mammogram is past due , please schedule and keep appt  We will call about your vit D result

## 2011-02-12 LAB — VITAMIN D 1,25 DIHYDROXY: Vitamin D 1, 25 (OH)2 Total: 84 pg/mL — ABNORMAL HIGH (ref 18–72)

## 2011-02-14 NOTE — Assessment & Plan Note (Signed)
Will update lab to see if supplement still needed

## 2011-02-14 NOTE — Assessment & Plan Note (Signed)
Improved. Pt applauded on succesful weight loss through lifestyle change, and encouraged to continue same. Weight loss goal set for the next several months.  

## 2011-02-14 NOTE — Progress Notes (Signed)
  Subjective:    Patient ID: Allison [name], female    DOB: 03/04/1940, 70 y.o.   MRN: [id number]  HPI The PT is here for follow up and re-evaluation of chronic medical conditions, medication management and review of any available recent lab and radiology data.  Preventive health is updated, specifically  Cancer screening and Immunization.   Questions or concerns regarding consultations or procedures which the PT has had in the interim are  addressed. The PT denies any adverse reactions to current medications since the last visit.  There are no new concerns.  There are no specific complaints       Review of Systems See HPI Denies recent fever or chills. Denies sinus pressure, nasal congestion, ear pain or sore throat. Denies chest congestion, productive cough or wheezing. Denies chest pains, palpitations and leg swelling Denies abdominal pain, nausea, vomiting,diarrhea or constipation.   Denies dysuria, frequency, hesitancy or incontinence. Denies joint pain, swelling and limitation in mobility. Denies headaches, seizures, numbness, or tingling. Denies depression, anxiety or insomnia. Denies skin break down or rash.        Objective:   Physical Exam  Patient alert and oriented and in no cardiopulmonary distress.  HEENT: No facial asymmetry, EOMI, no sinus tenderness,  oropharynx pink and moist.  Neck supple no adenopathy.  Chest: Clear to auscultation bilaterally.  CVS: S1, S2 no murmurs, no S3.  ABD: Soft non tender. Bowel sounds normal.  Ext: No edema  MS: Adequate ROM spine, shoulders, hips and knees.  Skin: Intact, no ulcerations or rash noted.  Psych: Good eye contact, normal affect. Memory intact not anxious or depressed appearing.  CNS: CN 2-12 intact, power, tone and sensation normal throughout.       Assessment & Plan:   

## 2011-02-14 NOTE — Assessment & Plan Note (Signed)
Improved with normal HBA1C which is excellent

## 2011-02-15 ENCOUNTER — Other Ambulatory Visit: Payer: Self-pay | Admitting: Family Medicine

## 2011-07-06 ENCOUNTER — Other Ambulatory Visit: Payer: Self-pay | Admitting: Family Medicine

## 2011-07-06 DIAGNOSIS — Z139 Encounter for screening, unspecified: Secondary | ICD-10-CM

## 2011-07-09 ENCOUNTER — Ambulatory Visit (HOSPITAL_COMMUNITY)
Admission: RE | Admit: 2011-07-09 | Discharge: 2011-07-09 | Disposition: A | Discharge status: Home / Self Care | Payer: Medicare Other | Source: Ambulatory Visit | Attending: Family Medicine | Admitting: Family Medicine

## 2011-07-09 DIAGNOSIS — Z1231 Encounter for screening mammogram for malignant neoplasm of breast: Secondary | ICD-10-CM | POA: Insufficient documentation

## 2011-07-09 DIAGNOSIS — Z139 Encounter for screening, unspecified: Secondary | ICD-10-CM

## 2011-07-22 ENCOUNTER — Ambulatory Visit: Payer: Medicare Other | Admitting: Family Medicine

## 2011-07-29 LAB — COMPLETE METABOLIC PANEL WITH GFR
ALT: 13 U/L (ref 0–35)
AST: 14 U/L (ref 0–37)
Albumin: 4.1 g/dL (ref 3.5–5.2)
Alkaline Phosphatase: 66 U/L (ref 39–117)
BUN: 13 mg/dL (ref 6–23)
Chloride: 107 mEq/L (ref 96–112)
Potassium: 4.1 mEq/L (ref 3.5–5.3)

## 2011-07-29 LAB — LIPID PANEL
Cholesterol: 165 mg/dL (ref 0–200)
LDL Cholesterol: 96 mg/dL (ref 0–99)
Total CHOL/HDL Ratio: 3.2 Ratio
VLDL: 18 mg/dL (ref 0–40)

## 2011-08-05 ENCOUNTER — Encounter: Payer: Self-pay | Admitting: Family Medicine

## 2011-08-05 ENCOUNTER — Ambulatory Visit (INDEPENDENT_AMBULATORY_CARE_PROVIDER_SITE_OTHER): Payer: Medicare Other | Admitting: Family Medicine

## 2011-08-05 VITALS — BP 128/76 | HR 70 | Resp 18 | Ht 63.5 in | Wt 186.0 lb

## 2011-08-05 DIAGNOSIS — E669 Obesity, unspecified: Secondary | ICD-10-CM

## 2011-08-05 DIAGNOSIS — R030 Elevated blood-pressure reading, without diagnosis of hypertension: Secondary | ICD-10-CM

## 2011-08-05 DIAGNOSIS — R7301 Impaired fasting glucose: Secondary | ICD-10-CM

## 2011-08-05 DIAGNOSIS — E785 Hyperlipidemia, unspecified: Secondary | ICD-10-CM

## 2011-08-05 MED ORDER — AMLODIPINE BESYLATE 2.5 MG PO TABS: 2.5000 mg | ORAL_TABLET | Freq: Every day | ORAL | Status: DC

## 2011-08-05 MED ORDER — PRAVASTATIN SODIUM 20 MG PO TABS: 20.0000 mg | ORAL_TABLET | Freq: Every evening | ORAL | Status: DC

## 2011-08-05 NOTE — Patient Instructions (Addendum)
Pelvic and breast exam in 4 month   You need a shingles vaccine, please ask at the pharmacy, you will get a script   Your blood pressure and cholesterol are excellent , no med changes.  Increase walking to twice daily  A healthy diet is rich in fruit, vegetables and whole grains. Poultry fish, nuts and beans are a healthy choice for protein rather then red meat. A low sodium diet and drinking 64 ounces of water daily is generally recommended. Oils and sweet should be limited. Carbohydrates especially for those who are diabetic or overweight, should be limited to 30-45 gram per meal. It is important to eat on a regular schedule, at least 3 times daily. Snacks should be primarily fruits, vegetables or nuts.

## 2011-08-08 NOTE — Assessment & Plan Note (Signed)
Controlled, no change in medication  

## 2011-08-08 NOTE — Assessment & Plan Note (Signed)
Improved. Pt applauded on succesful weight loss through lifestyle change, and encouraged to continue same. Weight loss goal set for the next several months.  

## 2011-08-08 NOTE — Assessment & Plan Note (Signed)
Importance of low carb diet with exercise and weight loss stressed.  Normal HBa1C in January

## 2011-08-08 NOTE — Progress Notes (Signed)
  Subjective:    Patient ID: Allison [name], female    DOB: May 28, 1940, 71 y.o.   MRN: [id number]  HPI The PT is here for follow up and re-evaluation of chronic medical conditions, medication management and review of any available recent lab and radiology data.  Preventive health is updated, specifically  Cancer screening and Immunization.   Questions or concerns regarding consultations or procedures which the PT has had in the interim are  addressed. The PT denies any adverse reactions to current medications since the last visit.  There are no new concerns.  There are no specific complaints       Review of Systems See HPI Denies recent fever or chills. Denies sinus pressure, nasal congestion, ear pain or sore throat. Denies chest congestion, productive cough or wheezing. Denies chest pains, palpitations and leg swelling Denies abdominal pain, nausea, vomiting,diarrhea or constipation.   Denies dysuria, frequency, hesitancy or incontinence. Denies joint pain, swelling and limitation in mobility. Denies headaches, seizures, numbness, or tingling. Denies depression, anxiety or insomnia. Denies skin break down or rash.        Objective:   Physical Exam  Patient alert and oriented and in no cardiopulmonary distress.  HEENT: No facial asymmetry, EOMI, no sinus tenderness,  oropharynx pink and moist.  Neck supple no adenopathy.  Chest: Clear to auscultation bilaterally.  CVS: S1, S2 no murmurs, no S3.  ABD: Soft non tender. Bowel sounds normal.  Ext: No edema  MS: Adequate ROM spine, shoulders, hips and knees.  Skin: Intact, no ulcerations or rash noted.  Psych: Good eye contact, normal affect. Memory intact not anxious or depressed appearing.  CNS: CN 2-12 intact, power, tone and sensation normal throughout.       Assessment & Plan:

## 2011-08-08 NOTE — Assessment & Plan Note (Signed)
Hyperlipidemia:Low fat diet discussed and encouraged.  Controlled, no change in medication   

## 2011-09-21 ENCOUNTER — Telehealth: Payer: Self-pay | Admitting: Family Medicine

## 2011-09-21 DIAGNOSIS — R030 Elevated blood-pressure reading, without diagnosis of hypertension: Secondary | ICD-10-CM

## 2011-09-21 DIAGNOSIS — E785 Hyperlipidemia, unspecified: Secondary | ICD-10-CM

## 2011-09-21 MED ORDER — AMLODIPINE BESYLATE 2.5 MG PO TABS: 2.5000 mg | ORAL_TABLET | Freq: Every day | ORAL | Status: DC

## 2011-09-21 MED ORDER — PRAVASTATIN SODIUM 20 MG PO TABS: 20.0000 mg | ORAL_TABLET | Freq: Every evening | ORAL | Status: DC

## 2011-09-21 NOTE — Telephone Encounter (Signed)
Sent in

## 2011-12-09 ENCOUNTER — Other Ambulatory Visit (HOSPITAL_COMMUNITY)
Admission: RE | Admit: 2011-12-09 | Discharge: 2011-12-09 | Disposition: A | Discharge status: Home / Self Care | Payer: Medicare Other | Source: Ambulatory Visit | Attending: Family Medicine | Admitting: Family Medicine

## 2011-12-09 ENCOUNTER — Ambulatory Visit (INDEPENDENT_AMBULATORY_CARE_PROVIDER_SITE_OTHER): Payer: Medicare Other | Admitting: Family Medicine

## 2011-12-09 VITALS — BP 126/78 | HR 72 | Resp 18 | Ht 63.5 in | Wt 192.0 lb

## 2011-12-09 DIAGNOSIS — D219 Benign neoplasm of connective and other soft tissue, unspecified: Secondary | ICD-10-CM

## 2011-12-09 DIAGNOSIS — N950 Postmenopausal bleeding: Secondary | ICD-10-CM

## 2011-12-09 DIAGNOSIS — D1739 Benign lipomatous neoplasm of skin and subcutaneous tissue of other sites: Secondary | ICD-10-CM

## 2011-12-09 DIAGNOSIS — Z01419 Encounter for gynecological examination (general) (routine) without abnormal findings: Secondary | ICD-10-CM | POA: Insufficient documentation

## 2011-12-09 DIAGNOSIS — Z0000 Encounter for general adult medical examination without abnormal findings: Secondary | ICD-10-CM

## 2011-12-09 DIAGNOSIS — D259 Leiomyoma of uterus, unspecified: Secondary | ICD-10-CM

## 2011-12-09 DIAGNOSIS — E785 Hyperlipidemia, unspecified: Secondary | ICD-10-CM

## 2011-12-09 DIAGNOSIS — Z1211 Encounter for screening for malignant neoplasm of colon: Secondary | ICD-10-CM

## 2011-12-09 DIAGNOSIS — Z1151 Encounter for screening for human papillomavirus (HPV): Secondary | ICD-10-CM | POA: Insufficient documentation

## 2011-12-09 DIAGNOSIS — L723 Sebaceous cyst: Secondary | ICD-10-CM

## 2011-12-09 LAB — POC HEMOCCULT BLD/STL (OFFICE/1-CARD/DIAGNOSTIC): Fecal Occult Blood, POC: NEGATIVE

## 2011-12-09 NOTE — Progress Notes (Signed)
  Subjective:    Patient ID: Allison [name], female    DOB: 1940-08-20, 71 y.o.   MRN: [id number]  HPI The PT is here for  Annual exam and re-evaluation of chronic medical conditions, medication management and review of any available recent lab and radiology data.  Preventive health is updated, specifically  Cancer screening and Immunization.  Refuses flu shot She has concerns re enlarging growth on posterior chest, right side, also regarding a sebaceous cyst on her right anterior chest which is also enlarging and bothersome. Pt reports post menopausal spotting in the past year, she has been evaluated in the past for this , and at that time the US showed thickened endometrium.With obtaining the current h/o bleeding pt seem unsure herself, but I do elicit unusual brown staining of underwear at times      Review of Systems See HPI Denies recent fever or chills. Denies sinus pressure, nasal congestion, ear pain or sore throat. Denies chest congestion, productive cough or wheezing. Denies chest pains, palpitations and leg swelling Denies abdominal pain, nausea, vomiting,diarrhea or constipation.   Denies dysuria, frequency, hesitancy or incontinence. Denies joint pain, swelling and limitation in mobility. Denies headaches, seizures, numbness, or tingling. Denies depression, anxiety or insomnia.       Objective:   Physical Exam  Pleasant well nourished female, alert and oriented x 3, in no cardio-pulmonary distress. Afebrile. HEENT No facial trauma or asymetry. Sinuses non tender.  EOMI, PERTL, fundoscopic exam is normal, no hemorhage or exudate.  External ears normal, tympanic membranes clear. Oropharynx moist, no exudate, poor dentition. Neck: supple, no adenopathy,JVD or thyromegaly.No bruits.  Chest: Clear to ascultation bilaterally.No crackles or wheezes. Non tender to palpation  Breast: No asymetry,no masses. No nipple discharge or inversion. No axillary or  supraclavicular adenopathy. No dimpling of skin, or rash  Cardiovascular system; Heart sounds normal,  S1 and  S2 ,no S3.  No murmur, or thrill. Apical beat not displaced Peripheral pulses normal.  Abdomen: Soft, non tender, no organomegaly or masses. No bruits. Bowel sounds normal. No guarding, tenderness or rebound.  Rectal:  No mass. Guaiac negative stool.  GU: External genitalia normal. No lesions.Normal female distribution of pubic hair Vaginal canal normal.Physiologic  discharge. Uterus enlarged, no adnexal masses, no cervical motion or adnexal tenderness.  Musculoskeletal exam: Full ROM of spine, hips , shoulders and knees. No deformity ,swelling or crepitus noted. No muscle wasting or atrophy.   Neurologic: Cranial nerves 2 to 12 intact. Power, tone ,sensation and reflexes normal throughout. No disturbance in gait. No tremor.  Skin: Intact, no ulceration, erythema , scaling or rash noted. Pigmentation normal throughout Lipoma on posterior right chest, diameter approx 8 inches. Sebaceous cyst on anterior right chest  Psych; Normal mood and affect. Judgement and concentration normal       Assessment & Plan:

## 2011-12-09 NOTE — Patient Instructions (Addendum)
Annual wellness in 5 months.  Fasting lipid, cmp and CBC in 5 month  You are referred to dr Arne Cleveland re cyston anterior chest and lump on back  You are referred for ultrasound of you pelvis since you report spotting and you will be refrred to dr Emelda Fear after you have these tests

## 2011-12-11 ENCOUNTER — Encounter: Payer: Self-pay | Admitting: Family Medicine

## 2011-12-11 DIAGNOSIS — Z0000 Encounter for general adult medical examination without abnormal findings: Secondary | ICD-10-CM | POA: Insufficient documentation

## 2011-12-11 NOTE — Assessment & Plan Note (Signed)
C/o post menopausal bleeding/spotting in the past year. 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 Rept ultrasounds, send pap, and refer to gyne

## 2011-12-11 NOTE — Assessment & Plan Note (Signed)
Sebaceous cyst anterior chest, right side, refer for surgery to remove, enlarging and deep

## 2011-12-11 NOTE — Assessment & Plan Note (Signed)
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 Diameter over 8 inches

## 2011-12-13 ENCOUNTER — Ambulatory Visit (HOSPITAL_COMMUNITY)
Admission: RE | Admit: 2011-12-13 | Discharge: 2011-12-13 | Disposition: A | Discharge status: Home / Self Care | Payer: Medicare Other | Source: Ambulatory Visit | Attending: Family Medicine | Admitting: Family Medicine

## 2011-12-13 ENCOUNTER — Ambulatory Visit (HOSPITAL_COMMUNITY): Payer: Medicare Other

## 2011-12-13 DIAGNOSIS — N7013 Chronic salpingitis and oophoritis: Secondary | ICD-10-CM | POA: Insufficient documentation

## 2011-12-13 DIAGNOSIS — D259 Leiomyoma of uterus, unspecified: Secondary | ICD-10-CM | POA: Insufficient documentation

## 2011-12-13 DIAGNOSIS — N950 Postmenopausal bleeding: Secondary | ICD-10-CM | POA: Insufficient documentation

## 2011-12-14 ENCOUNTER — Other Ambulatory Visit: Payer: Self-pay | Admitting: Family Medicine

## 2011-12-15 ENCOUNTER — Other Ambulatory Visit: Payer: Self-pay

## 2011-12-15 MED ORDER — FLUCONAZOLE 150 MG PO TABS: ORAL_TABLET | ORAL | Status: DC

## 2011-12-30 ENCOUNTER — Other Ambulatory Visit: Payer: Self-pay | Admitting: Obstetrics and Gynecology

## 2012-03-28 ENCOUNTER — Encounter (HOSPITAL_COMMUNITY): Payer: Self-pay | Admitting: Pharmacy Technician

## 2012-03-28 NOTE — H&P (Signed)
NT[facility] SOAP Note  Vital Signs:  Vitals as of: 03/28/2012: Systolic 149: Diastolic 95: Heart Rate 84: Temp 96.42F: Height 57ft 3in: Weight 193Lbs 0 Ounces: BMI 34  BMI : 34.19 kg/m2  Subjective: This 72 Year old Female presents for of a large mass on her back.  Has been present for many years, but recently is increasing in size and causing her discomfort.  Also has a sebaceous cyst in right upper chest anteriorly.  Review of Symptoms:    hot/cold Head:unremarkable    Eyes:unremarkable   Nose/Mouth/Throat:unremarkable Cardiovascular:  unremarkable   Respiratory:  dyspnea Gastrointestinal:  unremarkable   Genitourinary:    urgency Musculoskeletal:unremarkable   Hematolgic/Lymphatic:unremarkable     Allergic/Immunologic:unremarkable     Past Medical History:    Reviewed   Past Medical History  Surgical History: cholecystectomy Medical Problems:  High Blood pressure Allergies: nkda Medications: a bp pill, asa   Social History:Reviewed  Social History  Preferred Language: English Ethnicity: Not Hispanic / Latino Age: 77 Years 1 Months Marital Status:  M Alcohol:  No Recreational drug(s):  No   Smoking Status: Never smoker reviewed on 12/16/2011 Functional Status reviewed on mm/dd/yyyy ------------------------------------------------ Bathing: Normal Cooking: Normal Dressing: Normal Driving: Normal Eating: Normal Managing Meds: Normal Oral Care: Normal Shopping: Normal Toileting: Normal Transferring: Normal Walking: Normal Cognitive Status reviewed on mm/dd/yyyy ------------------------------------------------ Attention: Normal Decision Making: Normal Language: Normal Memory: Normal Motor: Normal Perception: Normal Problem Solving: Normal Visual and Spatial: Normal     Family History  Is there a family history of:CAD, DM    Objective Information: General:  Well appearing, well nourished in no distress.  Sebaceous cyst, right upper anterior chest.  Large >12cm ovoid, rubbery subcutaneous mass medial to right scapula on back. Heart:  RRR, no murmur Lungs:    CTA bilaterally, no wheezes, rhonchi, rales.  Breathing unlabored.  Assessment:Neoplasm, back, unspecified.  Sebaceous cyst, chest  Diagnosis &amp; Procedure: DiagnosisCode: 239.2, ProcedureCode: 16109,    Plan:Patient will call to schedule excision of neoplasm, back and excision of cyst, chest wall.   Patient Education:Alternative treatments to surgery were discussed with patient (and family).  Risks and benefits  of procedure were fully explained to the patient (and family) who gave informed consent. Patient/family questions were addressed.  Follow-up:Pending Surgery

## 2012-04-05 NOTE — Patient Instructions (Addendum)
Your procedure is scheduled on: 04/12/2012  Report to [facility] at 6:15    AM.  Call this number if you have problems the morning of surgery: [phone]   Remember:   Do not drink or eat food:After Midnight.  :  Take these medicines the morning of surgery with A SIP OF WATER: Amlodipine   Do not wear jewelry, make-up or nail polish.  Do not wear lotions, powders, or perfumes. You may wear deodorant.  Do not shave 48 hours prior to surgery. Men may shave face and neck.  Do not bring valuables to the hospital.  Contacts, dentures or bridgework may not be worn into surgery.  Leave suitcase in the car. After surgery it may be brought to your room.  For patients admitted to the hospital, checkout time is 11:00 AM the day of discharge.   Patients discharged the day of surgery will not be allowed to drive home.    Special Instructions: Shower using CHG 2 nights before surgery and the night before surgery.  If you shower the day of surgery use CHG.  Use special wash - you have one bottle of CHG for all showers.  You should use approximately 1/3 of the bottle for each shower.   Please read over the following fact sheets that you were given: Pain Booklet, MRSA Information, Surgical Site Infection Prevention and Care and Recovery After Surgery Excision of Skin Lesions Excision of a skin lesion refers to the removal of a section of skin by making small cuts (incisions) in the skin. This is typically done to remove a cancerous growth (basal cell carcinoma, squamous cell carcinoma, or melanoma) or a noncancerous growth (cyst). It may be done to treat or prevent cancer or infection. It may also be done to improve cosmetic appearance (removal of mole, skin tag). LET YOUR CAREGIVER KNOW ABOUT:   Allergies to food or medicine.  Medicines taken, including vitamins, herbs, eyedrops, over-the-counter medicines, and creams.  Use of steroids (by mouth or creams).  Previous problems with anesthetics or  numbing medicines.  History of bleeding problems or blood clots.  History of any prostheses.  Previous surgery.  Other health problems, including diabetes and kidney problems.  Possibility of pregnancy, if this applies. RISKS AND COMPLICATIONS  Many complications can be managed. With appropriate treatment and rehabilitation, the following complications are very uncommon:  Bleeding.  Infection.  Scarring.  Recurrence of cyst or cancer.  Changes in skin sensation or appearance (discoloration, swelling).  Reaction to anesthesia.  Allergic reaction to surgical materials or ointments.  Damage to nerves, blood vessels, muscles, or other structures.  Continued pain. BEFORE THE PROCEDURE  It is important to follow your caregiver's instructions prior to your procedure to avoid complications. Steps before your procedure may include:  Physical exam, blood tests, other procedures, such as removing a small sample for examination under a microscope (biopsy).  Your caregiver may review the procedure, the anesthesia being used, and what to expect after the procedure with you. You may be asked to:  Stop taking certain medicines, such as blood thinners (including aspirin, clopidogrel, ibuprofen), for several days prior to your procedure.  Take certain medicines.  Stop smoking. It is a good idea to arrange for a ride home after surgery and to have someone to help you with activities during recovery. PROCEDURE  There are several excision techniques. The type of excision or surgical technique used will depend on your condition, the location of the lesion, and your overall  health. After the lesion is sterilized and a local anesthetic is applied, the following may be performed: Complete surgical excision The area to be removed is marked with a pen. Using a small scalpel and scissors, the surgeon gently cuts around and under the lesion until it is completely removed. The lesion is placed in a  special fluid and sent to the lab for examination. If necessary, bleeding will be controlled with a device that delivers heat. The edges of the wound are stitched together and a dressing is applied. This procedure may be performed to treat a cancerous growth or noncancerous cyst or lesion. Surgeons commonly perform an elliptical excision, to minimize scarring. Excision of a cyst The surgeon makes an incision on the cyst. The entire cyst is removed through the incision. The wound may be closed with a suture (stitch). Shave excision During shave excision, the surgeon uses a small blade or loop instrument to shave off the lesion. This may be done to remove a mole or skin tag. The wound is usually left to heal on its own without stitches. Punch excision During punch excision, the surgeon uses a small, round tool (like a cookie cutter) to cut a circle shape out of the skin. The outer edges of the skin are stitched together. This may be done to remove a mole or scar or to perform a biopsy of the lesion. Mohs micrographic surgery During Mohs micrographic surgery, layers of the lesion are removed with a scalpel or loop instrument and immediately examined under a microscope until all of the abnormal or cancerous tissue is removed. This procedure is minimally invasive and ensures the best cosmetic outcome, with removal of as little normal tissue as possible. Mohs is usually done to treat skin cancer, such as basal cell carcinoma or squamous cell carcinoma, particularly on the face and ears. Antibiotic ointment is applied to the surgical area after each of the procedures listed above, as necessary. AFTER THE PROCEDURE  How well you heal depends on many factors. Most patients heal quite well with proper techniques and self-care. Scarring will lessen over time. HOME CARE INSTRUCTIONS   Take medicines for pain as directed.  Keep the incision area clean, dry, and protected for at least 48 hours. Change dressings as  directed.  For bleeding, apply gentle but firm pressure to the wound using a folded towel for 20 minutes. Call your caregiver if bleeding does not stop.  Avoid high-impact exercise and activities until the stitches are removed or the area heals.  Follow your caregiver's instructions to minimize scarring. Avoid sun exposure until the area has healed. Scarring should lessen over time.  Follow up with your caregiver as directed. Removal of stitches within 4 to 14 days may be necessary. Finding out the results of your test Not all test results are available during your visit. If your test results are not back during the visit, make an appointment with your caregiver to find out the results. Do not assume everything is normal if you have not heard from your caregiver or the medical facility. It is important for you to follow up on all of your test results. SEEK MEDICAL CARE IF:   You or your child has an oral temperature above 102 F (38.9 C).  You develop signs of infection (chills, feeling unwell).  You notice bleeding, pain, discharge, redness, or swelling at the incision site.  You notice skin irregularities or changes in sensation. MAKE SURE YOU:   Understand these instructions.  Will  watch your condition.  Will get help right away if you are not doing well or get worse. FOR MORE INFORMATION  American Academy [employer]: www.https://powers.com/ American Academy of Dermatology: InfoExam.si Document Released: 04/07/2009 Document Revised: 04/05/2011 Document Reviewed: 04/07/2009 [facility] Patient Information 2013 [place], [place].

## 2012-04-06 ENCOUNTER — Encounter (HOSPITAL_COMMUNITY)
Admission: RE | Admit: 2012-04-06 | Discharge: 2012-04-06 | Disposition: A | Discharge status: Home / Self Care | Payer: Medicare Other | Source: Ambulatory Visit | Attending: General Surgery | Admitting: General Surgery

## 2012-04-06 ENCOUNTER — Other Ambulatory Visit: Payer: Self-pay

## 2012-04-06 ENCOUNTER — Encounter (HOSPITAL_COMMUNITY): Payer: Self-pay

## 2012-04-06 HISTORY — DX: Essential (primary) hypertension: I10

## 2012-04-06 LAB — BASIC METABOLIC PANEL
BUN: 12 mg/dL (ref 6–23)
CO2: 24 mEq/L (ref 19–32)
Calcium: 9.6 mg/dL (ref 8.4–10.5)
Glucose, Bld: 85 mg/dL (ref 70–99)
Potassium: 4 mEq/L (ref 3.5–5.1)
Sodium: 140 mEq/L (ref 135–145)

## 2012-04-06 LAB — CBC WITH DIFFERENTIAL/PLATELET
Basophils Relative: 0 % (ref 0–1)
Eosinophils Absolute: 0.2 10*3/uL (ref 0.0–0.7)
Eosinophils Relative: 3 % (ref 0–5)
Hemoglobin: 13.4 g/dL (ref 12.0–15.0)
Lymphs Abs: 3.3 10*3/uL (ref 0.7–4.0)
MCH: 29.5 pg (ref 26.0–34.0)
MCHC: 33.9 g/dL (ref 30.0–36.0)
MCV: 86.8 fL (ref 78.0–100.0)
Monocytes Relative: 6 % (ref 3–12)
RBC: 4.55 MIL/uL (ref 3.87–5.11)

## 2012-04-12 ENCOUNTER — Encounter (HOSPITAL_COMMUNITY)
Admission: RE | Disposition: A | Discharge status: Home / Self Care | Payer: Self-pay | Source: Ambulatory Visit | Attending: General Surgery

## 2012-04-12 ENCOUNTER — Ambulatory Visit (HOSPITAL_COMMUNITY)
Admission: RE | Admit: 2012-04-12 | Discharge: 2012-04-12 | Disposition: A | Discharge status: Home / Self Care | Payer: Medicare Other | Source: Ambulatory Visit | Attending: General Surgery | Admitting: General Surgery

## 2012-04-12 ENCOUNTER — Ambulatory Visit (HOSPITAL_COMMUNITY): Payer: Medicare Other | Admitting: Anesthesiology

## 2012-04-12 ENCOUNTER — Encounter (HOSPITAL_COMMUNITY): Payer: Self-pay | Admitting: Anesthesiology

## 2012-04-12 ENCOUNTER — Encounter (HOSPITAL_COMMUNITY): Payer: Self-pay | Admitting: *Deleted

## 2012-04-12 DIAGNOSIS — D1739 Benign lipomatous neoplasm of skin and subcutaneous tissue of other sites: Secondary | ICD-10-CM | POA: Insufficient documentation

## 2012-04-12 DIAGNOSIS — L723 Sebaceous cyst: Secondary | ICD-10-CM | POA: Insufficient documentation

## 2012-04-12 DIAGNOSIS — Z01812 Encounter for preprocedural laboratory examination: Secondary | ICD-10-CM | POA: Insufficient documentation

## 2012-04-12 DIAGNOSIS — I10 Essential (primary) hypertension: Secondary | ICD-10-CM | POA: Insufficient documentation

## 2012-04-12 DIAGNOSIS — Z01810 Encounter for preprocedural cardiovascular examination: Secondary | ICD-10-CM | POA: Insufficient documentation

## 2012-04-12 HISTORY — PX: LESION REMOVAL: SHX5196

## 2012-04-12 HISTORY — PX: BREAST CYST EXCISION: SHX579

## 2012-04-12 SURGERY — EXCISION, CYST, BREAST
Anesthesia: General | Site: Chest | Laterality: Right | Wound class: Clean

## 2012-04-12 MED ORDER — SUCCINYLCHOLINE CHLORIDE 20 MG/ML IJ SOLN
INTRAMUSCULAR | Status: AC
  Filled 2012-04-12: qty 1

## 2012-04-12 MED ORDER — FENTANYL CITRATE 0.05 MG/ML IJ SOLN
INTRAMUSCULAR | Status: AC
  Filled 2012-04-12: qty 2

## 2012-04-12 MED ORDER — MIDAZOLAM HCL 2 MG/2ML IJ SOLN
1.0000 mg | INTRAMUSCULAR | Status: DC | PRN
  Administered 2012-04-12: 2 mg via INTRAVENOUS

## 2012-04-12 MED ORDER — SUCCINYLCHOLINE CHLORIDE 20 MG/ML IJ SOLN
INTRAMUSCULAR | Status: DC | PRN
  Administered 2012-04-12: 100 mg via INTRAVENOUS

## 2012-04-12 MED ORDER — CEFAZOLIN SODIUM-DEXTROSE 2-3 GM-% IV SOLR
2.0000 g | INTRAVENOUS | Status: AC
  Administered 2012-04-12: 2 g via INTRAVENOUS

## 2012-04-12 MED ORDER — FENTANYL CITRATE 0.05 MG/ML IJ SOLN: 25.0000 ug | INTRAMUSCULAR | Status: DC | PRN

## 2012-04-12 MED ORDER — CHLORHEXIDINE GLUCONATE 4 % EX LIQD: 1.0000 "application " | Freq: Once | CUTANEOUS | Status: DC

## 2012-04-12 MED ORDER — LIDOCAINE HCL (PF) 1 % IJ SOLN
INTRAMUSCULAR | Status: AC
  Filled 2012-04-12: qty 5

## 2012-04-12 MED ORDER — FENTANYL CITRATE 0.05 MG/ML IJ SOLN
INTRAMUSCULAR | Status: DC | PRN
  Administered 2012-04-12: 50 ug via INTRAVENOUS
  Administered 2012-04-12 (×2): 25 ug via INTRAVENOUS

## 2012-04-12 MED ORDER — EPHEDRINE SULFATE 50 MG/ML IJ SOLN
INTRAMUSCULAR | Status: AC
  Filled 2012-04-12: qty 1

## 2012-04-12 MED ORDER — CEFAZOLIN SODIUM-DEXTROSE 2-3 GM-% IV SOLR
INTRAVENOUS | Status: AC
  Filled 2012-04-12: qty 50

## 2012-04-12 MED ORDER — POVIDONE-IODINE 10 % EX OINT
TOPICAL_OINTMENT | CUTANEOUS | Status: AC
  Filled 2012-04-12: qty 4

## 2012-04-12 MED ORDER — PROPOFOL 10 MG/ML IV BOLUS
INTRAVENOUS | Status: DC | PRN
  Administered 2012-04-12: 150 mg via INTRAVENOUS
  Administered 2012-04-12: 20 mg via INTRAVENOUS
  Administered 2012-04-12 (×2): 30 mg via INTRAVENOUS
  Administered 2012-04-12: 20 mg via INTRAVENOUS

## 2012-04-12 MED ORDER — PROPOFOL 10 MG/ML IV EMUL
INTRAVENOUS | Status: AC
  Filled 2012-04-12: qty 20

## 2012-04-12 MED ORDER — EPHEDRINE SULFATE 50 MG/ML IJ SOLN
INTRAMUSCULAR | Status: DC | PRN
  Administered 2012-04-12: 5 mg via INTRAVENOUS
  Administered 2012-04-12 (×3): 10 mg via INTRAVENOUS
  Administered 2012-04-12: 5 mg via INTRAVENOUS

## 2012-04-12 MED ORDER — KETOROLAC TROMETHAMINE 30 MG/ML IJ SOLN
INTRAMUSCULAR | Status: AC
  Filled 2012-04-12: qty 1

## 2012-04-12 MED ORDER — ONDANSETRON HCL 4 MG/2ML IJ SOLN: 4.0000 mg | Freq: Once | INTRAMUSCULAR | Status: DC | PRN

## 2012-04-12 MED ORDER — BUPIVACAINE HCL (PF) 0.5 % IJ SOLN
INTRAMUSCULAR | Status: AC
  Filled 2012-04-12: qty 30

## 2012-04-12 MED ORDER — ONDANSETRON HCL 4 MG/2ML IJ SOLN
INTRAMUSCULAR | Status: AC
  Filled 2012-04-12: qty 2

## 2012-04-12 MED ORDER — SODIUM CHLORIDE 0.9 % IR SOLN
Status: DC | PRN
  Administered 2012-04-12: 1000 mL

## 2012-04-12 MED ORDER — HYDROCODONE-ACETAMINOPHEN 5-325 MG PO TABS: 1.0000 | ORAL_TABLET | Freq: Four times a day (QID) | ORAL | Status: DC | PRN

## 2012-04-12 MED ORDER — BUPIVACAINE HCL (PF) 0.5 % IJ SOLN
INTRAMUSCULAR | Status: DC | PRN
  Administered 2012-04-12: 10 mL
  Administered 2012-04-12: 1 mL

## 2012-04-12 MED ORDER — KETOROLAC TROMETHAMINE 30 MG/ML IJ SOLN
30.0000 mg | Freq: Once | INTRAMUSCULAR | Status: AC
  Administered 2012-04-12: 30 mg via INTRAVENOUS

## 2012-04-12 MED ORDER — MIDAZOLAM HCL 2 MG/2ML IJ SOLN
INTRAMUSCULAR | Status: AC
  Filled 2012-04-12: qty 2

## 2012-04-12 MED ORDER — LACTATED RINGERS IV SOLN
INTRAVENOUS | Status: DC
  Administered 2012-04-12 (×2): via INTRAVENOUS

## 2012-04-12 MED ORDER — ONDANSETRON HCL 4 MG/2ML IJ SOLN
4.0000 mg | Freq: Once | INTRAMUSCULAR | Status: AC
  Administered 2012-04-12: 4 mg via INTRAVENOUS

## 2012-04-12 MED ORDER — LIDOCAINE HCL (CARDIAC) 10 MG/ML IV SOLN
INTRAVENOUS | Status: DC | PRN
  Administered 2012-04-12: 20 mg via INTRAVENOUS

## 2012-04-12 SURGICAL SUPPLY — 37 items
BAG HAMPER (MISCELLANEOUS) ×3 IMPLANT
CLOTH BEACON ORANGE TIMEOUT ST (SAFETY) ×3 IMPLANT
COVER LIGHT HANDLE STERIS (MISCELLANEOUS) ×6 IMPLANT
DECANTER SPIKE VIAL GLASS SM (MISCELLANEOUS) ×3 IMPLANT
DERMABOND ADVANCED (GAUZE/BANDAGES/DRESSINGS) ×1
DERMABOND ADVANCED .7 DNX12 (GAUZE/BANDAGES/DRESSINGS) ×2 IMPLANT
DRAPE LAPAROTOMY TRNSV 102X78 (DRAPE) ×3 IMPLANT
DRAPE PROXIMA HALF (DRAPES) ×3 IMPLANT
DRAPE UTILITY W/TAPE 26X15 (DRAPES) ×6 IMPLANT
DURAPREP 26ML APPLICATOR (WOUND CARE) ×6 IMPLANT
ELECT REM PT RETURN 9FT ADLT (ELECTROSURGICAL) ×3
ELECTRODE REM PT RTRN 9FT ADLT (ELECTROSURGICAL) ×2 IMPLANT
GLOVE BIO SURGEON STRL SZ7.5 (GLOVE) ×6 IMPLANT
GLOVE BIOGEL PI IND STRL 7.0 (GLOVE) ×2 IMPLANT
GLOVE BIOGEL PI INDICATOR 7.0 (GLOVE) ×1
GLOVE EXAM NITRILE MD LF STRL (GLOVE) ×3 IMPLANT
GLOVE OPTIFIT SS 6.5 STRL BRWN (GLOVE) ×3 IMPLANT
GOWN STRL REIN XL XLG (GOWN DISPOSABLE) ×9 IMPLANT
KIT ROOM TURNOVER APOR (KITS) ×3 IMPLANT
MANIFOLD NEPTUNE II (INSTRUMENTS) ×3 IMPLANT
NEEDLE HYPO 25X1 1.5 SAFETY (NEEDLE) ×3 IMPLANT
NS IRRIG 1000ML POUR BTL (IV SOLUTION) ×3 IMPLANT
PACK MINOR (CUSTOM PROCEDURE TRAY) ×3 IMPLANT
PAD ARMBOARD 7.5X6 YLW CONV (MISCELLANEOUS) ×3 IMPLANT
SET BASIN LINEN APH (SET/KITS/TRAYS/PACK) ×3 IMPLANT
SPONGE GAUZE 2X2 8PLY STRL LF (GAUZE/BANDAGES/DRESSINGS) ×3 IMPLANT
SPONGE GAUZE 4X4 12PLY (GAUZE/BANDAGES/DRESSINGS) ×3 IMPLANT
SPONGE LAP 18X18 X RAY DECT (DISPOSABLE) ×3 IMPLANT
STRIP CLOSURE SKIN 1/4X3 (GAUZE/BANDAGES/DRESSINGS) ×3 IMPLANT
SUT PROLENE 3 0 PS 2 (SUTURE) ×12 IMPLANT
SUT PROLENE 4 0 PS 2 18 (SUTURE) ×9 IMPLANT
SUT VIC AB 3-0 SH 27 (SUTURE) ×3
SUT VIC AB 3-0 SH 27X BRD (SUTURE) ×6 IMPLANT
SUT VIC AB 4-0 PS2 27 (SUTURE) ×3 IMPLANT
SUT VICRYL AB 3-0 FS1 BRD 27IN (SUTURE) ×3 IMPLANT
SYR CONTROL 10ML LL (SYRINGE) ×3 IMPLANT
TAPE CLOTH SURG 4X10 WHT LF (GAUZE/BANDAGES/DRESSINGS) ×3 IMPLANT

## 2012-04-12 NOTE — Interval H&P Note (Signed)
History and Physical Interval Note:  04/12/2012 7:27 AM  Allison [name]  has presented today for surgery, with the diagnosis of sebaceous cyst right breast, neoplasm on back  The various methods of treatment have been discussed with the patient and family. After consideration of risks, benefits and other options for treatment, the patient has consented to  Procedure(s): SEBACEOUS CYST EXCISION BREAST (Right) EXCISION NEOPLASM ON BACK (N/A) as a surgical intervention .  The patient's history has been reviewed, patient examined, no change in status, stable for surgery.  I have reviewed the patient's chart and labs.  Questions were answered to the patient's satisfaction.     Franky Macho A

## 2012-04-12 NOTE — Anesthesia Preprocedure Evaluation (Signed)
Anesthesia Evaluation  Patient identified by MRN, date of birth, ID band Patient awake    Reviewed: Allergy & Precautions, H&P , NPO status , Patient's Chart, lab work & pertinent test results  History of Anesthesia Complications Negative for: history of anesthetic complications  Airway Mallampati: II TM Distance: >3 FB     Dental  (+) Edentulous Upper, Poor Dentition, Chipped, Missing and Dental Advisory Given   Pulmonary shortness of breath and with exertion,  breath sounds clear to auscultation        Cardiovascular hypertension, Pt. on medications Rhythm:Regular Rate:Normal     Neuro/Psych    GI/Hepatic   Endo/Other    Renal/GU      Musculoskeletal   Abdominal   Peds  Hematology   Anesthesia Other Findings   Reproductive/Obstetrics                           Anesthesia Physical Anesthesia Plan  ASA: II  Anesthesia Plan: General   Post-op Pain Management:    Induction: Intravenous  Airway Management Planned: LMA  Additional Equipment:   Intra-op Plan:   Post-operative Plan: Extubation in OR  Informed Consent: I have reviewed the patients History and Physical, chart, labs and discussed the procedure including the risks, benefits and alternatives for the proposed anesthesia with the patient or authorized representative who has indicated his/her understanding and acceptance.     Plan Discussed with:   Anesthesia Plan Comments:         Anesthesia Quick Evaluation

## 2012-04-12 NOTE — Anesthesia Procedure Notes (Addendum)
Procedure Name: LMA Insertion Date/Time: 04/12/2012 7:42 AM Performed by: Franco Nones Pre-anesthesia Checklist: Patient identified, Patient being monitored, Emergency Drugs available, Timeout performed and Suction available Patient Re-evaluated:Patient Re-evaluated prior to inductionOxygen Delivery Method: Circle System Utilized Preoxygenation: Pre-oxygenation with 100% oxygen Intubation Type: IV induction Ventilation: Mask ventilation without difficulty LMA: LMA inserted LMA Size: 5.0 Number of attempts: 1 Placement Confirmation: positive ETCO2 and breath sounds checked- equal and bilateral   Procedure Name: Intubation Date/Time: 04/12/2012 8:17 AM Performed by: Franco Nones Pre-anesthesia Checklist: Patient identified, Patient being monitored, Timeout performed, Emergency Drugs available and Suction available Patient Re-evaluated:Patient Re-evaluated prior to inductionOxygen Delivery Method: Circle System Utilized Preoxygenation: Pre-oxygenation with 100% oxygen Intubation Type: IV induction Ventilation: Mask ventilation without difficulty Laryngoscope Size: Miller and 2 Grade View: Grade I Tube type: Oral Tube size: 7.0 mm Number of attempts: 1 Airway Equipment and Method: stylet Placement Confirmation: ETT inserted through vocal cords under direct vision,  positive ETCO2 and breath sounds checked- equal and bilateral Secured at: 21 cm Tube secured with: Tape Dental Injury: Teeth and Oropharynx as per pre-operative assessment

## 2012-04-12 NOTE — Op Note (Signed)
Patient:  Allison [name]  DOB:  [birth year]  MRN:  [id number]   Preop Diagnosis:  Subcutaneous neoplasm, back: Sebaceous cyst, chest wall  Postop Diagnosis:  Same  Procedure:  Excision of neoplasm, back: Excision of sebaceous cyst, chest wall  Surgeon:  Franky Macho, M.D.  Anes:  General  Indications:  Patient is a 72 year old black female who presents with an enlarging mass which is subcutaneous nature in the upper back as well as a sebaceous cyst on the anterior chest wall. The risks and benefits of both procedures were fully explained to the patient, gave informed consent.  Procedure note:  The patient was placed in the supine position after general anesthesia was administered. The sebaceous cyst was present just to the right of the midline in the upper anterior chest. The patient was prepped with DuraPrep. Surgical site confirmation was performed.  An elliptical incision was made around the sebaceous cyst. The sebaceous cyst was removed at difficulty. It was disposed of. The skin was reapproximated using a 4-0 Vicryl subcuticular suture. 0.5% Sensorcaine was instilled the surrounding wound. Dermabond was then applied.  Next, the patient was placed in the left lateral decubitus position. The right upper back was prepped and draped using usual sterile technique with DuraPrep. Surgical site confirmation was performed.  The subcutaneous mass measured approximately 9 x 6 cm in size. A longitudinal incision was made down to the mass. It extended down to the muscle. The mass was fully excised. 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 It was sent to pathology for further examination. A bleeding was controlled using Bovie electrocautery. The subcutaneous layer was reapproximated using 3-0 Vicryl interrupted suture. The skin was closed using 3-0 Prolene interrupted sutures. 0.5% Sensorcaine was instilled the surrounding wound. Betadine ointment and dressed a dressing were applied.  All tape and needle counts  were correct at the end of the procedure. Patient was extubated in the operating room and transferred to PACU in stable condition.  Complications:  None  EBL:  Minimal  Specimen:  Neoplasm, back, subcutaneous

## 2012-04-12 NOTE — Transfer of Care (Signed)
Immediate Anesthesia Transfer of Care Note  Patient: Allison [name]  Procedure(s) Performed: Procedure(s) (LRB): SEBACEOUS CYST EXCISION BREAST (Right) EXCISION NEOPLASM ON BACK (N/A)  Patient Location: PACU  Anesthesia Type: General  Level of Consciousness: awake  Airway & Oxygen Therapy: Patient Spontanous Breathing and non-rebreather face mask  Post-op Assessment: Report given to PACU RN, Post -op Vital signs reviewed and stable and Patient moving all extremities  Post vital signs: Reviewed and stable  Complications: No apparent anesthesia complications

## 2012-04-12 NOTE — Anesthesia Postprocedure Evaluation (Signed)
Anesthesia Post Note  Patient: Allison [name]  Procedure(s) Performed: Procedure(s) (LRB): SEBACEOUS CYST EXCISION BREAST (Right) EXCISION NEOPLASM ON BACK (N/A)  Anesthesia type: General  Patient location: PACU  Post pain: Pain level controlled  Post assessment: Post-op Vital signs reviewed, Patient's Cardiovascular Status Stable, Respiratory Function Stable, Patent Airway, No signs of Nausea or vomiting and Pain level controlled  Last Vitals:  Filed Vitals:   04/12/12 0932  BP: 134/82  Pulse: 84  Temp: 36.4 C  Resp: 17    Post vital signs: Reviewed and stable  Level of consciousness: awake and alert   Complications: No apparent anesthesia complications

## 2012-04-14 ENCOUNTER — Encounter (HOSPITAL_COMMUNITY): Payer: Self-pay | Admitting: General Surgery

## 2012-05-11 ENCOUNTER — Ambulatory Visit: Payer: Medicare Other | Admitting: Family Medicine

## 2012-05-15 ENCOUNTER — Other Ambulatory Visit: Payer: Self-pay | Admitting: Obstetrics and Gynecology

## 2012-05-15 ENCOUNTER — Encounter: Payer: Self-pay | Admitting: Radiology

## 2012-05-15 DIAGNOSIS — N949 Unspecified condition associated with female genital organs and menstrual cycle: Secondary | ICD-10-CM

## 2012-05-19 ENCOUNTER — Encounter: Payer: Self-pay | Admitting: *Deleted

## 2012-05-19 DIAGNOSIS — I10 Essential (primary) hypertension: Secondary | ICD-10-CM | POA: Insufficient documentation

## 2012-05-22 ENCOUNTER — Other Ambulatory Visit: Payer: Self-pay

## 2012-05-22 ENCOUNTER — Telehealth: Payer: Self-pay | Admitting: Family Medicine

## 2012-05-22 ENCOUNTER — Telehealth: Payer: Self-pay

## 2012-05-22 DIAGNOSIS — R7301 Impaired fasting glucose: Secondary | ICD-10-CM

## 2012-05-22 DIAGNOSIS — E785 Hyperlipidemia, unspecified: Secondary | ICD-10-CM

## 2012-05-22 DIAGNOSIS — E559 Vitamin D deficiency, unspecified: Secondary | ICD-10-CM

## 2012-05-22 DIAGNOSIS — E669 Obesity, unspecified: Secondary | ICD-10-CM

## 2012-05-22 NOTE — Telephone Encounter (Signed)
Opened in error

## 2012-05-22 NOTE — Telephone Encounter (Signed)
Labs re-ordered

## 2012-05-25 ENCOUNTER — Ambulatory Visit: Payer: Self-pay | Admitting: Obstetrics and Gynecology

## 2012-05-25 ENCOUNTER — Other Ambulatory Visit: Payer: Self-pay

## 2012-05-31 LAB — CBC WITH DIFFERENTIAL/PLATELET
Lymphocytes Relative: 58 % — ABNORMAL HIGH (ref 12–46)
Lymphs Abs: 3.6 10*3/uL (ref 0.7–4.0)
Neutrophils Relative %: 31 % — ABNORMAL LOW (ref 43–77)
Platelets: 398 10*3/uL (ref 150–400)
RBC: 4.57 MIL/uL (ref 3.87–5.11)
WBC: 6.2 10*3/uL (ref 4.0–10.5)

## 2012-05-31 LAB — LIPID PANEL
Total CHOL/HDL Ratio: 2.9 Ratio
VLDL: 19 mg/dL (ref 0–40)

## 2012-05-31 LAB — COMPREHENSIVE METABOLIC PANEL
ALT: 13 U/L (ref 0–35)
CO2: 25 mEq/L (ref 19–32)
Calcium: 8.9 mg/dL (ref 8.4–10.5)
Chloride: 109 mEq/L (ref 96–112)
Sodium: 142 mEq/L (ref 135–145)
Total Protein: 7.5 g/dL (ref 6.0–8.3)

## 2012-06-06 ENCOUNTER — Other Ambulatory Visit: Payer: Self-pay | Admitting: Family Medicine

## 2012-06-06 ENCOUNTER — Ambulatory Visit (INDEPENDENT_AMBULATORY_CARE_PROVIDER_SITE_OTHER): Payer: Medicare Other | Admitting: Family Medicine

## 2012-06-06 ENCOUNTER — Encounter: Payer: Self-pay | Admitting: Family Medicine

## 2012-06-06 VITALS — BP 132/78 | HR 78 | Resp 18 | Ht 63.5 in | Wt 196.1 lb

## 2012-06-06 DIAGNOSIS — E785 Hyperlipidemia, unspecified: Secondary | ICD-10-CM

## 2012-06-06 DIAGNOSIS — E669 Obesity, unspecified: Secondary | ICD-10-CM

## 2012-06-06 DIAGNOSIS — I10 Essential (primary) hypertension: Secondary | ICD-10-CM

## 2012-06-06 DIAGNOSIS — Z139 Encounter for screening, unspecified: Secondary | ICD-10-CM

## 2012-06-06 MED ORDER — CALCIUM CARBONATE-VITAMIN D 500-200 MG-UNIT PO TABS: 1.0000 | ORAL_TABLET | Freq: Two times a day (BID) | ORAL | Status: DC

## 2012-06-06 NOTE — Progress Notes (Signed)
  Subjective:    Patient ID: Allison [name], female    DOB: 05-12-1940, 72 y.o.   MRN: [id number]  HPI The PT is here for follow up and re-evaluation of chronic medical conditions, medication management and review of any available recent lab and radiology data.  Preventive health is updated, specifically  Cancer screening and Immunization.   Questions or concerns regarding consultations or procedures which the PT has had in the interim are  addressed. The PT denies any adverse reactions to current medications since the last visit.  There are no new concerns.  There are no specific complaints       Review of Systems See HPI Denies recent fever or chills. Denies sinus pressure, nasal congestion, ear pain or sore throat. Denies chest congestion, productive cough or wheezing. Denies chest pains, palpitations and leg swelling Denies abdominal pain, nausea, vomiting,diarrhea or constipation.   Denies dysuria, frequency, hesitancy or incontinence. Denies joint pain, swelling and limitation in mobility. Denies headaches, seizures, numbness, or tingling. Denies depression, anxiety or insomnia. Denies skin break down or rash.        Objective:   Physical Exam  Patient alert and oriented and in no cardiopulmonary distress.  HEENT: No facial asymmetry, EOMI, no sinus tenderness,  oropharynx pink and moist.  Neck supple no adenopathy.  Chest: Clear to auscultation bilaterally.  CVS: S1, S2 no murmurs, no S3.  ABD: Soft non tender. Bowel sounds normal.  Ext: No edema  MS: Adequate ROM spine, shoulders, hips and knees.  Skin: Intact, no ulcerations or rash noted.  Psych: Good eye contact, normal affect. Memory intact not anxious or depressed appearing.  CNS: CN 2-12 intact, power, tone and sensation normal throughout.       Assessment & Plan:

## 2012-06-06 NOTE — Patient Instructions (Addendum)
Annual wellness Novemebr 18 or after  Please commit to regular exercise and healthy eating, a lot of vegetable and fruit.  You need to lose weight   Blood pressure is excellent.  You need to continue with gynecology as long as you have spotting after menopause, so keep appointments please   Start calcium with D twice daily, sent to yourpharmacy for bone health  Mammogram to be scheduled at checkout Fasting lipid, cmp and TSH in Novemebr

## 2012-06-09 ENCOUNTER — Other Ambulatory Visit: Payer: Self-pay

## 2012-06-09 MED ORDER — CALCIUM CARBONATE-VITAMIN D 500-200 MG-UNIT PO TABS: 1.0000 | ORAL_TABLET | Freq: Two times a day (BID) | ORAL | Status: DC

## 2012-06-11 NOTE — Assessment & Plan Note (Signed)
Controlled, no change in medication DASH diet and commitment to daily physical activity for a minimum of 30 minutes discussed and encouraged, as a part of hypertension management. The importance of attaining a healthy weight is also discussed.  

## 2012-06-11 NOTE — Assessment & Plan Note (Signed)
Controlled, no change in medication Hyperlipidemia:Low fat diet discussed and encouraged.  \ 

## 2012-06-11 NOTE — Assessment & Plan Note (Signed)
Improved. Pt applauded on succesful weight loss through lifestyle change, and encouraged to continue same. Weight loss goal set for the next several months.  

## 2012-07-11 ENCOUNTER — Ambulatory Visit (HOSPITAL_COMMUNITY)
Admission: RE | Admit: 2012-07-11 | Discharge: 2012-07-11 | Disposition: A | Discharge status: Home / Self Care | Payer: Medicare Other | Source: Ambulatory Visit | Attending: Family Medicine | Admitting: Family Medicine

## 2012-07-11 DIAGNOSIS — Z1231 Encounter for screening mammogram for malignant neoplasm of breast: Secondary | ICD-10-CM | POA: Insufficient documentation

## 2012-07-11 DIAGNOSIS — Z139 Encounter for screening, unspecified: Secondary | ICD-10-CM

## 2012-07-25 ENCOUNTER — Other Ambulatory Visit: Payer: Self-pay | Admitting: Family Medicine

## 2013-01-23 ENCOUNTER — Encounter: Payer: Medicare Other | Admitting: Family Medicine

## 2013-02-15 ENCOUNTER — Other Ambulatory Visit: Payer: Self-pay | Admitting: Family Medicine

## 2013-02-15 LAB — COMPREHENSIVE METABOLIC PANEL
ALBUMIN: 4.3 g/dL (ref 3.5–5.2)
ALT: 14 U/L (ref 0–35)
AST: 14 U/L (ref 0–37)
Alkaline Phosphatase: 82 U/L (ref 39–117)
BUN: 14 mg/dL (ref 6–23)
CALCIUM: 9.2 mg/dL (ref 8.4–10.5)
CHLORIDE: 105 meq/L (ref 96–112)
CO2: 27 meq/L (ref 19–32)
CREATININE: 0.85 mg/dL (ref 0.50–1.10)
Glucose, Bld: 105 mg/dL — ABNORMAL HIGH (ref 70–99)
POTASSIUM: 4.2 meq/L (ref 3.5–5.3)
Sodium: 138 mEq/L (ref 135–145)
Total Bilirubin: 1.1 mg/dL (ref 0.3–1.2)
Total Protein: 7.7 g/dL (ref 6.0–8.3)

## 2013-02-16 LAB — LIPID PANEL
CHOL/HDL RATIO: 2.5 ratio
CHOLESTEROL: 172 mg/dL (ref 0–200)
HDL: 69 mg/dL (ref >39)
LDL Cholesterol: 89 mg/dL (ref 0–99)
Triglycerides: 69 mg/dL (ref <150)
VLDL: 14 mg/dL (ref 0–40)

## 2013-02-21 ENCOUNTER — Ambulatory Visit (INDEPENDENT_AMBULATORY_CARE_PROVIDER_SITE_OTHER): Payer: Medicare Other | Admitting: Family Medicine

## 2013-02-21 ENCOUNTER — Encounter (INDEPENDENT_AMBULATORY_CARE_PROVIDER_SITE_OTHER): Payer: Self-pay

## 2013-02-21 ENCOUNTER — Encounter: Payer: Self-pay | Admitting: Family Medicine

## 2013-02-21 VITALS — BP 130/82 | HR 72 | Resp 16 | Ht 63.5 in | Wt 196.0 lb

## 2013-02-21 DIAGNOSIS — R7301 Impaired fasting glucose: Secondary | ICD-10-CM

## 2013-02-21 DIAGNOSIS — N950 Postmenopausal bleeding: Secondary | ICD-10-CM

## 2013-02-21 DIAGNOSIS — E785 Hyperlipidemia, unspecified: Secondary | ICD-10-CM

## 2013-02-21 DIAGNOSIS — Z0000 Encounter for general adult medical examination without abnormal findings: Secondary | ICD-10-CM

## 2013-02-21 DIAGNOSIS — Z13820 Encounter for screening for osteoporosis: Secondary | ICD-10-CM

## 2013-02-21 DIAGNOSIS — I10 Essential (primary) hypertension: Secondary | ICD-10-CM

## 2013-02-21 MED ORDER — PRAVASTATIN SODIUM 20 MG PO TABS: ORAL_TABLET | ORAL | Status: DC

## 2013-02-21 MED ORDER — AMLODIPINE BESYLATE 2.5 MG PO TABS: ORAL_TABLET | ORAL | Status: DC

## 2013-02-21 MED ORDER — CALCIUM CARBONATE-VITAMIN D 500-200 MG-UNIT PO TABS
1.0000 | ORAL_TABLET | Freq: Two times a day (BID) | ORAL | Status: DC
Start: 2013-02-21 — End: 2013-10-22

## 2013-02-21 NOTE — Patient Instructions (Signed)
F/u in 5 month, call if you need me before  Work on strengthening muscles so less wetting accidents  Work on lifestyle change to reduce heart disease risk  Pls reconsider the flu vaccine  Fasting lipid, cmp , HBa1C and CBc in 5 month, before visit  You are referred for bone density test  Metabolic Syndrome, Adult Metabolic syndrome descibes a group of risk factors for heart disease and diabetes. This syndrome has other names including Insulin Resistance Syndrome. The more risk factors you have, the higher your risk of having a heart attack, stroke, or developing diabetes. These risk factors include:  High blood sugar.  High blood triglyceride (a fat found in the blood) level.  High blood pressure.  Abdominal obesity (your extra weight is around your waist instead of your hips).  Low levels of high-density lipoprotein, HDL (good blood cholesterol). If you have any three of these risk factors, you have metabolic syndrome. If you have even one of these factors, you should make lifestyle changes to improve your health in order to prevent serious health diseases.  In people with metabolic syndrome, the cells do not respond properly to insulin. This can lead to high levels of glucose in the blood, which can interfere with normal body processes. Eventually, this can cause high blood pressure and higher fat levels in the blood, and inflammation of your blood vessels. The result can be heart disease and stroke.  CAUSES   Eating a diet rich in calories and saturated fat.  Too little physical activity.  Being overweight. Other underlying causes are:  Family history (genetics).  Ethnicity (South Asians are at a higher risk).  Older age (your chances of developing metabolic syndrome are higher as you grow older).  Insulin resistance. SYMPTOMS  By itself, metabolic syndrome has no symptoms. However, you might have symptoms of diabetes (high blood sugar) or high blood pressure, such  as:  Increased thirst, urination, and tiredness.  Dizzy spells.  Dull headaches that are unusual for you.  Blurred vision.  Nosebleeds. DIAGNOSIS  Your caregiver may make a diagnosis of metabolic syndrome if you have at least three of these factors:  If you are overweight mostly around the waist. This means a waistline greater than 40" in men and more than 35" in women. The waistline limits are 31 to 35 inches for women and 37 to 39 inches for men. In those who have certain genetic risk factors, such as having a family history of diabetes or being of Asian descent.  If you have a blood pressure of 130/85 mm Hg or more, or if you are being treated for high blood pressure.  If your blood triglyceride level is 150 mg/dL or more, or you are being treated for high levels of triglyceride.  If the level of HDL in your blood is below 40 mg/dL in men, less than 50 mg/dL in women, or you are receiving treatment for low levels of HDL.  If the level of sugar in your blood is high with fasting blood sugar level of 110 mg/dL or more, or you are under treatment for diabetes. TREATMENT  Your caregiver may have you make lifestyle changes, which may include:  Exercise.  Losing weight.  Maintaining a healthy diet.  Quitting smoking. The lifestyle changes listed above are key in reducing your risk for heart disease and stroke. Medicines may also be prescribed to help your body respond to insulin better and to reduce your blood pressure and blood fat levels. Aspirin  may be recommended to reduce risks of heart disease or stroke.  HOME CARE INSTRUCTIONS   Exercise.  Measure your waist at regular intervals just above the hipbones after you have breathed out.  Maintain a healthy diet.  Eat fruits, such as apples, oranges, and pears.  Eat vegetables.  Eat legumes, such as kidney beans, peas, and lentils.  Eat food rich in soluble fiber, such as whole grain cereal, oatmeal, and oat bran.  Use  olive or safflower oils and avoid saturated fats.  Eat nuts.  Limit the amount of salt you eat or add to food.  Limit the amount of alcohol you drink.  Include fish in your diet, if possible.  Stop smoking if you are a smoker.  Maintain regular follow-up appointments.  Follow your caregiver's advice. SEEK MEDICAL CARE IF:   You feel very tired or fatigued.  You develop excessive thirst.  You pass large quantities of urine.  You are putting on weight around your waist rather than losing weight.  You develop headaches over and over again.  You have off-and-on dizzy spells. SEEK IMMEDIATE MEDICAL CARE IF:   You develop nosebleeds.  You develop sudden blurred vision.  You develop sudden dizzy spells.  You develop chest pains, trouble breathing, or feel an abnormal or irregular heart beat.  You have a fainting episode.  You develop any sudden trouble speaking and/or swallowing.  You develop sudden weakness in one arm and/or one leg. MAKE SURE YOU:   Understand these instructions.  Will watch your condition.  Will get help right away if you are not doing well or get worse. Document Released: 04/20/2007 Document Revised: 04/05/2011 Document Reviewed: 04/20/2007 [facility] Patient Information 2014 [place], [place].

## 2013-02-21 NOTE — Progress Notes (Signed)
Subjective:    Patient ID: Allison [name], female    DOB: 05/09/1940, 73 y.o.   MRN: [id number]  HPI Preventive Screening-Counseling & Management   Patient present here today for a Medicare annual wellness visit.   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 Worked at a [employer].Mom of 7 living children  Risk Factors  Current exercise habits:  Inadequate, will work consistently toward a goal of 5 day pere week  Dietary issues discussed:heart healthy diet   Cardiac risk factors:  Metabolic syndrome discussed Depression Screen  (Note: if answer to either of the following is "Yes", a more complete depression screening is indicated)   Over the past two weeks, have you felt down, depressed or hopeless? No  Over the past two weeks, have you felt little interest or pleasure in doing things? No  Have you lost interest or pleasure in daily life? No  Do you often feel hopeless? No  Do you cry easily over simple problems? No   Activities of Daily Living  In your present state of health, do you have any difficulty performing the following activities?  Driving?: No Managing money?: No Feeding yourself?:No Getting from bed to chair?:No Climbing a flight of stairs?:No Preparing food and eating?:No Bathing or showering?:No Getting dressed?:No Getting to the toilet?:No Using the toilet?:No Moving around from place to place?: No  Fall Risk Assessment In the past year have you fallen or had a near fall?:No Are you currently taking any medications that make you dizzy?:No   Hearing Difficulties: No Do you often ask people to speak up or repeat themselves?:No Do you experience ringing or noises in your  ears?:No Do you have difficulty understanding soft or whispered voices?:No  Cognitive Testing  Alert? Yes Normal Appearance?Yes  Oriented to person? Yes Place? Yes  Time? Yes  Displays appropriate judgment?Yes  Can read the correct time from a watch face? yes Are you having problems remembering things?No  Advanced Directives have been discussed with the patient?Yes , full code   List the Names of Other Physician/Practitioners you currently use: None   Indicate any recent Medical Services you may have received from other than [facility] providers in the past year (date may be approximate).   Assessment:    Annual Wellness Exam   Plan:    During the course of the visit the patient was educated and counseled about appropriate screening and preventive services including:  A healthy diet is rich in fruit, vegetables and whole grains. Poultry fish, nuts and beans are a healthy choice for protein rather then red meat. A low sodium diet and drinking 64 ounces of water daily is generally recommended. Oils and sweet should be limited. Carbohydrates especially for those who are diabetic or overweight, should be limited to 30-45 gram per meal. It is important to eat on a regular schedule, at least 3 times daily. Snacks should be primarily fruits, vegetables or nuts. It is important that you exercise regularly at least 30 minutes 5 times a week. If you develop chest pain, have severe difficulty breathing, or feel very tired, stop exercising immediately and seek medical attention  Immunization reviewed and updated. Cancer screening reviewed and updated    Patient Instructions (  the written plan) was given to the patient.  Medicare Attestation  I have personally reviewed:  The patient's medical and social history  Their use of alcohol, tobacco or illicit drugs  Their current medications and supplements  The patient's functional ability including ADLs,fall risks, home safety risks, cognitive, and  hearing and visual impairment  Diet and physical activities  Evidence for depression or mood disorders  The patient's weight, height, BMI, and visual acuity have been recorded in the chart. I have made referrals, counseling, and provided education to the patient based on review of the above and I have provided the patient with a written personalized care plan for preventive services.      Review of Systems     Objective:   Physical Exam        Assessment & Plan:

## 2013-02-21 NOTE — Assessment & Plan Note (Addendum)
Annual exam as documented. Counseling done  re healthy lifestyle involving commitment to 150 minutes exercise per week, heart healthy diet, and attaining healthy weight.The importance of adequate sleep also discussed. Regular seat belt use and safe storage  of firearms if patient has them, is also discussed. Changes in health habits are decided on by the patient with goals and time frames  set for achieving them. Immunization and cancer screening needs are specifically addressed at this visit., refuse flu vaccine, up to date with all others

## 2013-02-27 ENCOUNTER — Other Ambulatory Visit (HOSPITAL_COMMUNITY): Payer: Medicare Other

## 2013-03-12 ENCOUNTER — Ambulatory Visit (HOSPITAL_COMMUNITY)
Admission: RE | Admit: 2013-03-12 | Discharge: 2013-03-12 | Disposition: A | Discharge status: Home / Self Care | Payer: Medicare Other | Source: Ambulatory Visit | Attending: Family Medicine | Admitting: Family Medicine

## 2013-03-12 DIAGNOSIS — Z13820 Encounter for screening for osteoporosis: Secondary | ICD-10-CM

## 2013-03-12 DIAGNOSIS — M899 Disorder of bone, unspecified: Secondary | ICD-10-CM | POA: Insufficient documentation

## 2013-03-12 DIAGNOSIS — Z780 Asymptomatic menopausal state: Secondary | ICD-10-CM | POA: Insufficient documentation

## 2013-03-12 DIAGNOSIS — M949 Disorder of cartilage, unspecified: Principal | ICD-10-CM

## 2013-03-17 ENCOUNTER — Encounter: Payer: Self-pay | Admitting: Family Medicine

## 2013-03-17 DIAGNOSIS — M8580 Other specified disorders of bone density and structure, unspecified site: Secondary | ICD-10-CM | POA: Insufficient documentation

## 2013-07-23 LAB — CBC WITH DIFFERENTIAL/PLATELET
Basophils Absolute: 0.1 10*3/uL (ref 0.0–0.1)
Basophils Relative: 1 % (ref 0–1)
EOS PCT: 4 % (ref 0–5)
Eosinophils Absolute: 0.2 10*3/uL (ref 0.0–0.7)
HEMATOCRIT: 39.4 % (ref 36.0–46.0)
HEMOGLOBIN: 13 g/dL (ref 12.0–15.0)
LYMPHS ABS: 3.1 10*3/uL (ref 0.7–4.0)
LYMPHS PCT: 54 % — AB (ref 12–46)
MCH: 28.6 pg (ref 26.0–34.0)
MCHC: 33 g/dL (ref 30.0–36.0)
MCV: 86.6 fL (ref 78.0–100.0)
MONO ABS: 0.3 10*3/uL (ref 0.1–1.0)
MONOS PCT: 6 % (ref 3–12)
Neutro Abs: 2.1 10*3/uL (ref 1.7–7.7)
Neutrophils Relative %: 35 % — ABNORMAL LOW (ref 43–77)
Platelets: 359 10*3/uL (ref 150–400)
RBC: 4.55 MIL/uL (ref 3.87–5.11)
RDW: 13.7 % (ref 11.5–15.5)
WBC: 5.7 10*3/uL (ref 4.0–10.5)

## 2013-07-24 ENCOUNTER — Ambulatory Visit: Payer: Medicare Other | Admitting: Family Medicine

## 2013-07-24 LAB — LIPID PANEL
CHOL/HDL RATIO: 3.3 ratio
CHOLESTEROL: 200 mg/dL (ref 0–200)
HDL: 60 mg/dL (ref >39)
LDL CALC: 122 mg/dL — AB (ref 0–99)
TRIGLYCERIDES: 92 mg/dL (ref <150)
VLDL: 18 mg/dL (ref 0–40)

## 2013-07-24 LAB — COMPREHENSIVE METABOLIC PANEL
ALK PHOS: 69 U/L (ref 39–117)
ALT: 13 U/L (ref 0–35)
AST: 12 U/L (ref 0–37)
Albumin: 3.9 g/dL (ref 3.5–5.2)
BUN: 14 mg/dL (ref 6–23)
CO2: 28 mEq/L (ref 19–32)
CREATININE: 0.77 mg/dL (ref 0.50–1.10)
Calcium: 8.9 mg/dL (ref 8.4–10.5)
Chloride: 105 mEq/L (ref 96–112)
Glucose, Bld: 115 mg/dL — ABNORMAL HIGH (ref 70–99)
Potassium: 4.2 mEq/L (ref 3.5–5.3)
Sodium: 139 mEq/L (ref 135–145)
Total Bilirubin: 0.8 mg/dL (ref 0.2–1.2)
Total Protein: 7 g/dL (ref 6.0–8.3)

## 2013-07-24 LAB — HEMOGLOBIN A1C
Hgb A1c MFr Bld: 5.7 % — ABNORMAL HIGH (ref <5.7)
Mean Plasma Glucose: 117 mg/dL — ABNORMAL HIGH (ref <117)

## 2013-07-30 ENCOUNTER — Ambulatory Visit (INDEPENDENT_AMBULATORY_CARE_PROVIDER_SITE_OTHER): Payer: Medicare Other | Admitting: Family Medicine

## 2013-07-30 ENCOUNTER — Encounter: Payer: Self-pay | Admitting: Family Medicine

## 2013-07-30 ENCOUNTER — Encounter (INDEPENDENT_AMBULATORY_CARE_PROVIDER_SITE_OTHER): Payer: Self-pay

## 2013-07-30 VITALS — BP 144/80 | HR 72 | Resp 18 | Ht 63.5 in | Wt 197.0 lb

## 2013-07-30 DIAGNOSIS — R7301 Impaired fasting glucose: Secondary | ICD-10-CM

## 2013-07-30 DIAGNOSIS — E669 Obesity, unspecified: Secondary | ICD-10-CM

## 2013-07-30 DIAGNOSIS — I10 Essential (primary) hypertension: Secondary | ICD-10-CM

## 2013-07-30 DIAGNOSIS — E559 Vitamin D deficiency, unspecified: Secondary | ICD-10-CM

## 2013-07-30 DIAGNOSIS — M949 Disorder of cartilage, unspecified: Secondary | ICD-10-CM

## 2013-07-30 DIAGNOSIS — M899 Disorder of bone, unspecified: Secondary | ICD-10-CM

## 2013-07-30 DIAGNOSIS — E785 Hyperlipidemia, unspecified: Secondary | ICD-10-CM

## 2013-07-30 NOTE — Patient Instructions (Addendum)
Annual  Physical  exam in 4 months   Blood pressure is too high, NO  INCREASE in  Amlodipine at this time. Continue  2.5 mg tabs one  every day, work on regular exercise , weight loss, and incrased  Intake of fresh vegetable and fruit and low salt diet.  Bad cholesterol is high, please reduce fried and fatty food so that your bad cholesterol improves, also remember to take medication EVERY night as prescribed  It is important that you exercise regularly at least 30 minutes 5 times a week. If you develop chest pain, have severe difficulty breathing, or feel very tired, stop exercising immediately and seek medical attention    A healthy diet is rich in fruit, vegetables and whole grains. Poultry fish, nuts and beans are a healthy choice for protein rather then red meat. A low sodium diet and drinking 64 ounces of water daily is generally recommended. Oils and sweet should be limited. Carbohydrates especially for those who are diabetic or overweight, should be limited to 34-45 gram per meal. It is important to eat on a regular schedule, at least 3 times daily. Snacks should be primarily fruits, vegetables or nuts.  Fasting lipid , cmp , hBa1C, TSH, CBc, vit D in 4 month

## 2013-10-09 NOTE — Assessment & Plan Note (Signed)
unchnaged Patient educated about the importance of limiting  Carbohydrate intake , the need to commit to daily physical activity for a minimum of 30 minutes , and to commit weight loss. The fact that changes in all these areas will reduce or eliminate all together the development of diabetes is stressed.

## 2013-10-09 NOTE — Assessment & Plan Note (Addendum)
Uncontrolled , no med change at this time, behavioral modification only as generally well controlled DASH diet and commitment to daily physical activity for a minimum of 30 minutes discussed and encouraged, as a part of hypertension management. The importance of attaining a healthy weight is also discussed.

## 2013-10-09 NOTE — Assessment & Plan Note (Signed)
Uncontrolled, takes medication inconsistently Re educated about importance in taking medication as prescribed Hyperlipidemia:Low fat diet discussed and encouraged.  Updated lab needed at/ before next visit.

## 2013-10-10 NOTE — Assessment & Plan Note (Signed)
Unchanged. Patient re-educated about  the importance of commitment to a  minimum of 150 minutes of exercise per week. The importance of healthy food choices with portion control discussed. Encouraged to start a food diary, count calories and to consider  joining a support group. Sample diet sheets offered. Goals set by the patient for the next several months.    

## 2013-10-10 NOTE — Progress Notes (Signed)
   Subjective:    Patient ID: Allison [name], female    DOB: [birth year], 73 y.o.   MRN: [id number]  HPI The PT is here for follow up and re-evaluation of chronic medical conditions, medication management and review of any available recent lab and radiology data.  Preventive health is updated, specifically  Cancer screening and Immunization.    The PT denies any adverse reactions to current medications since the last visit.  There are no new concerns.  There are no specific complaints       Review of Systems See HPI Denies recent fever or chills. Denies sinus pressure, nasal congestion, ear pain or sore throat. Denies chest congestion, productive cough or wheezing. Denies chest pains, palpitations and leg swelling Denies abdominal pain, nausea, vomiting,diarrhea or constipation.   Denies dysuria, frequency, hesitancy or incontinence. Denies joint pain, swelling and limitation in mobility. Denies headaches, seizures, numbness, or tingling. Denies depression, anxiety or insomnia. Denies skin break down or rash.        Objective:   Physical Exam  BP 144/80  Pulse 72  Resp 18  Ht 5' 3.5" (1.613 m)  Wt 197 lb (89.359 kg)  BMI 34.35 kg/m2  SpO2 97% Patient alert and oriented and in no cardiopulmonary distress.  HEENT: No facial asymmetry, EOMI,   oropharynx pink and moist.  Neck supple no JVD, no mass.  Chest: Clear to auscultation bilaterally.  CVS: S1, S2 no murmurs, no S3.Regular rate.  ABD: Soft non tender.   Ext: No edema  MS: Adequate ROM spine, shoulders, hips and knees.  Skin: Intact, no ulcerations or rash noted.  Psych: Good eye contact, normal affect. Memory intact not anxious or depressed appearing.  CNS: CN 2-12 intact, power,  normal throughout.no focal deficits noted.       Assessment & Plan:  HTN (hypertension) Uncontrolled , no med change at this time, behavioral modification only as generally well controlled DASH diet and commitment to  daily physical activity for a minimum of 30 minutes discussed and encouraged, as a part of hypertension management. The importance of attaining a healthy weight is also discussed.   IMPAIRED FASTING GLUCOSE unchnaged Patient educated about the importance of limiting  Carbohydrate intake , the need to commit to daily physical activity for a minimum of 30 minutes , and to commit weight loss. The fact that changes in all these areas will reduce or eliminate all together the development of diabetes is stressed.     HYPERLIPIDEMIA Uncontrolled, takes medication inconsistently Re educated about importance in taking medication as prescribed Hyperlipidemia:Low fat diet discussed and encouraged.  Updated lab needed at/ before next visit.   OBESITY Unchanged. Patient re-educated about  the importance of commitment to a  minimum of 150 minutes of exercise per week. The importance of healthy food choices with portion control discussed. Encouraged to start a food diary, count calories and to consider  joining a support group. Sample diet sheets offered. Goals set by the patient for the next several months.

## 2013-10-22 ENCOUNTER — Emergency Department (HOSPITAL_COMMUNITY): Payer: Medicare Other

## 2013-10-22 ENCOUNTER — Encounter (HOSPITAL_COMMUNITY): Payer: Self-pay | Admitting: Emergency Medicine

## 2013-10-22 ENCOUNTER — Observation Stay (HOSPITAL_COMMUNITY)
Admission: EM | Admit: 2013-10-22 | Discharge: 2013-10-24 | Disposition: A | Discharge status: Home / Self Care | Payer: Medicare Other | Attending: Family Medicine | Admitting: Family Medicine

## 2013-10-22 DIAGNOSIS — K219 Gastro-esophageal reflux disease without esophagitis: Secondary | ICD-10-CM | POA: Diagnosis not present

## 2013-10-22 DIAGNOSIS — N1330 Unspecified hydronephrosis: Secondary | ICD-10-CM | POA: Insufficient documentation

## 2013-10-22 DIAGNOSIS — E559 Vitamin D deficiency, unspecified: Secondary | ICD-10-CM | POA: Insufficient documentation

## 2013-10-22 DIAGNOSIS — E785 Hyperlipidemia, unspecified: Secondary | ICD-10-CM | POA: Diagnosis not present

## 2013-10-22 DIAGNOSIS — M899 Disorder of bone, unspecified: Secondary | ICD-10-CM | POA: Diagnosis not present

## 2013-10-22 DIAGNOSIS — N201 Calculus of ureter: Secondary | ICD-10-CM | POA: Diagnosis not present

## 2013-10-22 DIAGNOSIS — M949 Disorder of cartilage, unspecified: Secondary | ICD-10-CM | POA: Diagnosis not present

## 2013-10-22 DIAGNOSIS — N23 Unspecified renal colic: Secondary | ICD-10-CM

## 2013-10-22 DIAGNOSIS — H269 Unspecified cataract: Secondary | ICD-10-CM | POA: Diagnosis not present

## 2013-10-22 DIAGNOSIS — E669 Obesity, unspecified: Secondary | ICD-10-CM | POA: Diagnosis present

## 2013-10-22 DIAGNOSIS — I10 Essential (primary) hypertension: Secondary | ICD-10-CM

## 2013-10-22 DIAGNOSIS — R0902 Hypoxemia: Secondary | ICD-10-CM

## 2013-10-22 LAB — CBC WITH DIFFERENTIAL/PLATELET
BASOS ABS: 0 10*3/uL (ref 0.0–0.1)
Basophils Relative: 0 % (ref 0–1)
EOS PCT: 0 % (ref 0–5)
Eosinophils Absolute: 0 10*3/uL (ref 0.0–0.7)
HCT: 40.6 % (ref 36.0–46.0)
Hemoglobin: 13.6 g/dL (ref 12.0–15.0)
Lymphocytes Relative: 20 % (ref 12–46)
Lymphs Abs: 2.4 10*3/uL (ref 0.7–4.0)
MCH: 29.6 pg (ref 26.0–34.0)
MCHC: 33.5 g/dL (ref 30.0–36.0)
MCV: 88.3 fL (ref 78.0–100.0)
Monocytes Absolute: 0.5 10*3/uL (ref 0.1–1.0)
Monocytes Relative: 5 % (ref 3–12)
Neutro Abs: 8.9 10*3/uL — ABNORMAL HIGH (ref 1.7–7.7)
Neutrophils Relative %: 75 % (ref 43–77)
PLATELETS: 338 10*3/uL (ref 150–400)
RBC: 4.6 MIL/uL (ref 3.87–5.11)
RDW: 13.5 % (ref 11.5–15.5)
WBC: 11.9 10*3/uL — AB (ref 4.0–10.5)

## 2013-10-22 LAB — COMPREHENSIVE METABOLIC PANEL
ALBUMIN: 4 g/dL (ref 3.5–5.2)
ALT: 14 U/L (ref 0–35)
AST: 16 U/L (ref 0–37)
Alkaline Phosphatase: 81 U/L (ref 39–117)
Anion gap: 13 (ref 5–15)
BUN: 13 mg/dL (ref 6–23)
CALCIUM: 8.9 mg/dL (ref 8.4–10.5)
CO2: 25 mEq/L (ref 19–32)
Chloride: 102 mEq/L (ref 96–112)
Creatinine, Ser: 1.03 mg/dL (ref 0.50–1.10)
GFR calc Af Amer: 61 mL/min — ABNORMAL LOW (ref >90)
GFR calc non Af Amer: 52 mL/min — ABNORMAL LOW (ref >90)
Glucose, Bld: 144 mg/dL — ABNORMAL HIGH (ref 70–99)
Potassium: 3.7 mEq/L (ref 3.7–5.3)
Sodium: 140 mEq/L (ref 137–147)
Total Bilirubin: 0.6 mg/dL (ref 0.3–1.2)
Total Protein: 8.6 g/dL — ABNORMAL HIGH (ref 6.0–8.3)

## 2013-10-22 LAB — TROPONIN I: Troponin I: <0.3 ng/mL (ref <0.30)

## 2013-10-22 LAB — LIPASE, BLOOD: LIPASE: 11 U/L (ref 11–59)

## 2013-10-22 MED ORDER — SODIUM CHLORIDE 0.9 % IV SOLN
Freq: Once | INTRAVENOUS | Status: DC
Start: 2013-10-22 — End: 2013-10-24

## 2013-10-22 MED ORDER — IOHEXOL 300 MG/ML  SOLN
50.0000 mL | Freq: Once | INTRAMUSCULAR | Status: AC | PRN
  Administered 2013-10-22: 50 mL via ORAL

## 2013-10-22 MED ORDER — MORPHINE SULFATE 4 MG/ML IJ SOLN
4.0000 mg | Freq: Once | INTRAMUSCULAR | Status: AC
  Administered 2013-10-22: 4 mg via INTRAVENOUS
  Filled 2013-10-22: qty 1

## 2013-10-22 MED ORDER — ONDANSETRON HCL 4 MG/2ML IJ SOLN
4.0000 mg | Freq: Once | INTRAMUSCULAR | Status: AC
  Administered 2013-10-22: 4 mg via INTRAVENOUS
  Filled 2013-10-22: qty 2

## 2013-10-22 MED ORDER — FENTANYL CITRATE 0.05 MG/ML IJ SOLN
50.0000 ug | Freq: Once | INTRAMUSCULAR | Status: AC
  Administered 2013-10-22: 50 ug via INTRAVENOUS
  Filled 2013-10-22: qty 2

## 2013-10-22 MED ORDER — IOHEXOL 300 MG/ML  SOLN
100.0000 mL | Freq: Once | INTRAMUSCULAR | Status: AC | PRN
  Administered 2013-10-22: 100 mL via INTRAVENOUS

## 2013-10-22 NOTE — ED Notes (Signed)
Pt c/o right side pain and vomiting x one day
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
## 2013-10-23 ENCOUNTER — Encounter (HOSPITAL_COMMUNITY): Payer: Medicare Other | Admitting: Anesthesiology

## 2013-10-23 ENCOUNTER — Observation Stay (HOSPITAL_COMMUNITY): Payer: Medicare Other

## 2013-10-23 ENCOUNTER — Encounter (HOSPITAL_COMMUNITY): Payer: Self-pay | Admitting: *Deleted

## 2013-10-23 ENCOUNTER — Encounter (HOSPITAL_COMMUNITY)
Admission: EM | Disposition: A | Discharge status: Home / Self Care | Payer: Self-pay | Source: Home / Self Care | Attending: Family Medicine

## 2013-10-23 ENCOUNTER — Inpatient Hospital Stay (HOSPITAL_COMMUNITY): Payer: Medicare Other

## 2013-10-23 ENCOUNTER — Inpatient Hospital Stay (HOSPITAL_COMMUNITY): Payer: Medicare Other | Admitting: Anesthesiology

## 2013-10-23 DIAGNOSIS — N1330 Unspecified hydronephrosis: Secondary | ICD-10-CM | POA: Diagnosis not present

## 2013-10-23 DIAGNOSIS — I10 Essential (primary) hypertension: Secondary | ICD-10-CM | POA: Diagnosis not present

## 2013-10-23 DIAGNOSIS — N23 Unspecified renal colic: Secondary | ICD-10-CM

## 2013-10-23 DIAGNOSIS — N201 Calculus of ureter: Secondary | ICD-10-CM | POA: Diagnosis present

## 2013-10-23 DIAGNOSIS — N200 Calculus of kidney: Secondary | ICD-10-CM

## 2013-10-23 DIAGNOSIS — K219 Gastro-esophageal reflux disease without esophagitis: Secondary | ICD-10-CM | POA: Diagnosis not present

## 2013-10-23 DIAGNOSIS — R0902 Hypoxemia: Secondary | ICD-10-CM

## 2013-10-23 HISTORY — PX: CYSTOSCOPY W/ URETERAL STENT PLACEMENT: SHX1429

## 2013-10-23 LAB — URINALYSIS, ROUTINE W REFLEX MICROSCOPIC
Bilirubin Urine: NEGATIVE
GLUCOSE, UA: NEGATIVE mg/dL
Ketones, ur: NEGATIVE mg/dL
LEUKOCYTES UA: NEGATIVE
Nitrite: NEGATIVE
PROTEIN: NEGATIVE mg/dL
SPECIFIC GRAVITY, URINE: 1.01 (ref 1.005–1.030)
Urobilinogen, UA: 0.2 mg/dL (ref 0.0–1.0)
pH: 8 (ref 5.0–8.0)

## 2013-10-23 LAB — COMPREHENSIVE METABOLIC PANEL
ALK PHOS: 75 U/L (ref 39–117)
ALT: 13 U/L (ref 0–35)
AST: 14 U/L (ref 0–37)
Albumin: 3.5 g/dL (ref 3.5–5.2)
Anion gap: 10 (ref 5–15)
BUN: 12 mg/dL (ref 6–23)
CO2: 26 mEq/L (ref 19–32)
Calcium: 8.4 mg/dL (ref 8.4–10.5)
Chloride: 103 mEq/L (ref 96–112)
Creatinine, Ser: 0.95 mg/dL (ref 0.50–1.10)
GFR calc non Af Amer: 58 mL/min — ABNORMAL LOW (ref >90)
GFR, EST AFRICAN AMERICAN: 67 mL/min — AB (ref >90)
GLUCOSE: 104 mg/dL — AB (ref 70–99)
Potassium: 3.9 mEq/L (ref 3.7–5.3)
SODIUM: 139 meq/L (ref 137–147)
TOTAL PROTEIN: 7.8 g/dL (ref 6.0–8.3)
Total Bilirubin: 0.8 mg/dL (ref 0.3–1.2)

## 2013-10-23 LAB — URINE MICROSCOPIC-ADD ON

## 2013-10-23 LAB — CBC
HCT: 37.7 % (ref 36.0–46.0)
HEMOGLOBIN: 12.7 g/dL (ref 12.0–15.0)
MCH: 29.5 pg (ref 26.0–34.0)
MCHC: 33.7 g/dL (ref 30.0–36.0)
MCV: 87.7 fL (ref 78.0–100.0)
PLATELETS: 341 10*3/uL (ref 150–400)
RBC: 4.3 MIL/uL (ref 3.87–5.11)
RDW: 13.5 % (ref 11.5–15.5)
WBC: 11.8 10*3/uL — ABNORMAL HIGH (ref 4.0–10.5)

## 2013-10-23 LAB — SURGICAL PCR SCREEN
MRSA, PCR: NEGATIVE
STAPHYLOCOCCUS AUREUS: NEGATIVE

## 2013-10-23 SURGERY — CYSTOSCOPY, WITH RETROGRADE PYELOGRAM AND URETERAL STENT INSERTION
Anesthesia: General | Laterality: Right

## 2013-10-23 MED ORDER — ENOXAPARIN SODIUM 40 MG/0.4ML ~~LOC~~ SOLN
40.0000 mg | SUBCUTANEOUS | Status: DC
  Administered 2013-10-23: 40 mg via SUBCUTANEOUS
  Filled 2013-10-23: qty 0.4

## 2013-10-23 MED ORDER — ONDANSETRON HCL 4 MG/2ML IJ SOLN
4.0000 mg | Freq: Four times a day (QID) | INTRAMUSCULAR | Status: DC | PRN
  Administered 2013-10-23: 4 mg via INTRAVENOUS
  Filled 2013-10-23: qty 2

## 2013-10-23 MED ORDER — ONDANSETRON HCL 4 MG/2ML IJ SOLN
INTRAMUSCULAR | Status: DC | PRN
  Administered 2013-10-23: 4 mg via INTRAVENOUS

## 2013-10-23 MED ORDER — ONDANSETRON HCL 4 MG/2ML IJ SOLN
INTRAMUSCULAR | Status: AC
  Filled 2013-10-23: qty 2

## 2013-10-23 MED ORDER — HYDROMORPHONE HCL 1 MG/ML IJ SOLN
0.5000 mg | INTRAMUSCULAR | Status: AC | PRN
  Administered 2013-10-23: 0.5 mg via INTRAVENOUS
  Filled 2013-10-23: qty 1

## 2013-10-23 MED ORDER — MIDAZOLAM HCL 5 MG/5ML IJ SOLN
INTRAMUSCULAR | Status: DC | PRN
  Administered 2013-10-23: 2 mg via INTRAVENOUS

## 2013-10-23 MED ORDER — LIDOCAINE HCL (PF) 1 % IJ SOLN
INTRAMUSCULAR | Status: AC
  Filled 2013-10-23: qty 5

## 2013-10-23 MED ORDER — CEPHALEXIN 250 MG PO CAPS: 250.0000 mg | ORAL_CAPSULE | Freq: Two times a day (BID) | ORAL | Status: DC

## 2013-10-23 MED ORDER — LIDOCAINE HCL 1 % IJ SOLN
INTRAMUSCULAR | Status: DC | PRN
  Administered 2013-10-23: 50 mg via INTRADERMAL

## 2013-10-23 MED ORDER — PROPOFOL 10 MG/ML IV EMUL
INTRAVENOUS | Status: AC
  Filled 2013-10-23: qty 20

## 2013-10-23 MED ORDER — PROPOFOL 10 MG/ML IV BOLUS
INTRAVENOUS | Status: DC | PRN
  Administered 2013-10-23: 140 mg via INTRAVENOUS

## 2013-10-23 MED ORDER — STERILE WATER FOR IRRIGATION IR SOLN
Status: DC | PRN
  Administered 2013-10-23: 3000 mL

## 2013-10-23 MED ORDER — SODIUM CHLORIDE 0.9 % IV SOLN
INTRAVENOUS | Status: AC
  Administered 2013-10-23: 1000 mL via INTRAVENOUS

## 2013-10-23 MED ORDER — SODIUM CHLORIDE 0.9 % IJ SOLN: 3.0000 mL | INTRAMUSCULAR | Status: DC | PRN

## 2013-10-23 MED ORDER — CEFTRIAXONE SODIUM 1 G IJ SOLR
1.0000 g | INTRAMUSCULAR | Status: DC
  Filled 2013-10-23 (×2): qty 10

## 2013-10-23 MED ORDER — ONDANSETRON HCL 4 MG PO TABS
4.0000 mg | ORAL_TABLET | Freq: Four times a day (QID) | ORAL | Status: DC | PRN
Start: 2013-10-23 — End: 2013-10-23

## 2013-10-23 MED ORDER — SODIUM CHLORIDE 0.9 % IV SOLN: 250.0000 mL | INTRAVENOUS | Status: DC | PRN

## 2013-10-23 MED ORDER — SODIUM CHLORIDE 0.9 % IJ SOLN
3.0000 mL | Freq: Two times a day (BID) | INTRAMUSCULAR | Status: DC
  Administered 2013-10-23: 3 mL via INTRAVENOUS

## 2013-10-23 MED ORDER — FENTANYL CITRATE 0.05 MG/ML IJ SOLN
INTRAMUSCULAR | Status: AC
  Filled 2013-10-23: qty 2

## 2013-10-23 MED ORDER — OXYBUTYNIN CHLORIDE 5 MG PO TABS: 5.0000 mg | ORAL_TABLET | Freq: Three times a day (TID) | ORAL | Status: DC

## 2013-10-23 MED ORDER — ACETAMINOPHEN 650 MG RE SUPP: 650.0000 mg | Freq: Four times a day (QID) | RECTAL | Status: DC | PRN

## 2013-10-23 MED ORDER — ACETAMINOPHEN 325 MG PO TABS: 650.0000 mg | ORAL_TABLET | Freq: Four times a day (QID) | ORAL | Status: DC | PRN

## 2013-10-23 MED ORDER — DEXTROSE 5 % IV SOLN
1.0000 g | Freq: Once | INTRAVENOUS | Status: AC
  Administered 2013-10-23: 1 g via INTRAVENOUS
  Filled 2013-10-23: qty 10

## 2013-10-23 MED ORDER — HYDROMORPHONE HCL 1 MG/ML IJ SOLN
1.0000 mg | INTRAMUSCULAR | Status: DC | PRN
  Administered 2013-10-23: 0.5 mg via INTRAVENOUS
  Filled 2013-10-23: qty 1

## 2013-10-23 MED ORDER — FENTANYL CITRATE 0.05 MG/ML IJ SOLN
INTRAMUSCULAR | Status: DC | PRN
  Administered 2013-10-23: 50 ug via INTRAVENOUS

## 2013-10-23 MED ORDER — SUCCINYLCHOLINE CHLORIDE 20 MG/ML IJ SOLN
INTRAMUSCULAR | Status: AC
  Filled 2013-10-23: qty 1

## 2013-10-23 MED ORDER — ONDANSETRON HCL 4 MG/2ML IJ SOLN: 4.0000 mg | Freq: Three times a day (TID) | INTRAMUSCULAR | Status: DC | PRN

## 2013-10-23 MED ORDER — LACTATED RINGERS IV SOLN
INTRAVENOUS | Status: DC | PRN
  Administered 2013-10-23: 16:00:00 via INTRAVENOUS

## 2013-10-23 MED ORDER — IOHEXOL 300 MG/ML  SOLN
INTRAMUSCULAR | Status: DC | PRN
  Administered 2013-10-23: 7 mL

## 2013-10-23 MED ORDER — SUCCINYLCHOLINE CHLORIDE 20 MG/ML IJ SOLN
INTRAMUSCULAR | Status: DC | PRN
  Administered 2013-10-23: 175 mg via INTRAVENOUS

## 2013-10-23 MED ORDER — MIDAZOLAM HCL 2 MG/2ML IJ SOLN
INTRAMUSCULAR | Status: AC
  Filled 2013-10-23: qty 2

## 2013-10-23 SURGICAL SUPPLY — 20 items
BAG DRAIN CYSTO-URO STER (DRAIN) ×3 IMPLANT
BAG HAMPER (MISCELLANEOUS) ×3 IMPLANT
CATH INTERMIT  6FR 70CM (CATHETERS) ×3 IMPLANT
CLOTH BEACON ORANGE TIMEOUT ST (SAFETY) ×3 IMPLANT
GLOVE BIOGEL M 8.0 STRL (GLOVE) ×3 IMPLANT
GLOVE BIOGEL PI IND STRL 7.0 (GLOVE) ×1 IMPLANT
GLOVE BIOGEL PI IND STRL 7.5 (GLOVE) ×2 IMPLANT
GLOVE BIOGEL PI INDICATOR 7.0 (GLOVE) ×2
GLOVE BIOGEL PI INDICATOR 7.5 (GLOVE) ×4
GLOVE SURG SS PI 7.5 STRL IVOR (GLOVE) ×3 IMPLANT
GOWN STRL REIN XL XLG (GOWN DISPOSABLE) ×3 IMPLANT
GUIDEWIRE STR DUAL SENSOR (WIRE) ×3 IMPLANT
IV NS IRRIG 3000ML ARTHROMATIC (IV SOLUTION) ×3 IMPLANT
KIT ROOM TURNOVER AP CYSTO (KITS) ×3 IMPLANT
MANIFOLD NEPTUNE II (INSTRUMENTS) ×3 IMPLANT
PACK CYSTO (CUSTOM PROCEDURE TRAY) ×3 IMPLANT
PAD ARMBOARD 7.5X6 YLW CONV (MISCELLANEOUS) ×3 IMPLANT
STENT CONTOUR 6FRX24X.038 (STENTS) ×3 IMPLANT
WATER STERILE IRR 1000ML POUR (IV SOLUTION) ×3 IMPLANT
WATER STERILE IRR 3000ML UROMA (IV SOLUTION) ×3 IMPLANT

## 2013-10-23 NOTE — Anesthesia Preprocedure Evaluation (Addendum)
Anesthesia Evaluation  Patient identified by MRN, date of birth, ID band Patient awake    Reviewed: Allergy & Precautions, H&P , NPO status , Patient's Chart, lab work & pertinent test results  Airway Mallampati: I TM Distance: >3 FB     Dental  (+) Edentulous Upper, Poor Dentition, Loose, Missing,    Pulmonary shortness of breath and with exertion,    + decreased breath sounds      Cardiovascular Exercise Tolerance: Good hypertension, Pt. on medications Rhythm:Regular Rate:Normal     Neuro/Psych    GI/Hepatic GERD-  ,  Endo/Other  Morbid obesity  Renal/GU      Musculoskeletal   Abdominal (+) + obese,  Abdomen: soft. Bowel sounds: normal.  Peds  Hematology   Anesthesia Other Findings   Reproductive/Obstetrics                        Anesthesia Physical Anesthesia Plan  ASA: III and emergent  Anesthesia Plan: General   Post-op Pain Management:    Induction: Intravenous  Airway Management Planned: LMA  Additional Equipment:   Intra-op Plan:   Post-operative Plan: Extubation in OR  Informed Consent: I have reviewed the patients History and Physical, chart, labs and discussed the procedure including the risks, benefits and alternatives for the proposed anesthesia with the patient or authorized representative who has indicated his/her understanding and acceptance.     Plan Discussed with: CRNA and Surgeon  Anesthesia Plan Comments:        Anesthesia Quick Evaluation

## 2013-10-23 NOTE — Discharge Instructions (Signed)

## 2013-10-23 NOTE — Transfer of Care (Signed)
Immediate Anesthesia Transfer of Care Note  Patient: Allison [name]  Procedure(s) Performed: Procedure(s): CYSTOSCOPY WITH RIGHT RETROGRADE PYELOGRAM AND RIGHT URETERAL STENT PLACEMENT (Right)  Patient Location: PACU  Anesthesia Type:General  Level of Consciousness: awake and patient cooperative  Airway & Oxygen Therapy: Patient Spontanous Breathing and Patient connected to face mask oxygen  Post-op Assessment: Report given to PACU RN, Post -op Vital signs reviewed and stable and Patient moving all extremities  Post vital signs: Reviewed and stable  Complications: No apparent anesthesia complications

## 2013-10-23 NOTE — Anesthesia Procedure Notes (Signed)
Procedure Name: Intubation Date/Time: 10/23/2013 4:54 PM Performed by: Charmaine Downs Pre-anesthesia Checklist: Emergency Drugs available, Suction available, Patient being monitored and Patient identified Patient Re-evaluated:Patient Re-evaluated prior to inductionOxygen Delivery Method: Circle system utilized Preoxygenation: Pre-oxygenation with 100% oxygen Intubation Type: IV induction, Rapid sequence and Cricoid Pressure applied Ventilation: Mask ventilation without difficulty and Oral airway inserted - appropriate to patient size Laryngoscope Size: Mac and 3 Grade View: Grade I Tube type: Oral Tube size: 7.0 mm Number of attempts: 1 Airway Equipment and Method: Stylet Placement Confirmation: ETT inserted through vocal cords under direct vision,  positive ETCO2 and breath sounds checked- equal and bilateral Secured at: 22 cm Tube secured with: Tape Dental Injury: Teeth and Oropharynx as per pre-operative assessment
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
## 2013-10-23 NOTE — Op Note (Signed)
Preoperative diagnosis: Right midureteral stone with hydronephrosis  Postoperative diagnosis: Same   Procedure: Cystoscopy, right retrograde ureteropyelogram with interpretive fluoroscopy, right double-J stent placement-24 cm x 6 French contour without string    Surgeon: Lillette Boxer. Tanaiya Kolarik, M.D.   Anesthesia: Gen.   Complications: None  Specimen(s): None  Drain(s): Previously noted stent  Indications: 73 year-old female with symptomatic right midureteral stone with proximal hydroureteronephrosis. She presented to the hospital yesterday and evaluation included a CT revealing an obstructing right midureteral stone. The patient had been symptomatic for some time, but her pain became worse within the past 48 hours. Because of her long-standing symptoms in the obstructing stone, it was recommended that she undergo right stent placement followed by eventual lithotripsy. Risks and complications of the procedure were discussed with the patient. She understands and desires to proceed.   Technique and findings:  The patient was identified and properly marked in the holding area. She was taken the operating room where general endotracheal anesthetic was administered. She was placed in the dorsolithotomy position. Genitalia and perineum were prepped and draped. Proper timeout was performed.  A 22 French panendoscope was advanced into her bladder. Circumferential inspection revealed a normal bladder about evidence of lesions. Ureteral orifices were normal in configuration and location.  A    6 French open-ended catheter was advanced in the right ureteral orifice. Retrograde ureteropyelogram was performed. This revealed a normalmid and distal ureter. The proximal ureter revealed hydroureter. There is a filling defect in the mid ureter. Pyelocalyceal system was capacious with blunting of the calyces. No filling defects were noted.  I then advanced a sensor-tip guidewire through the open-ended  catheter, passed the obstructing stone the right renal pelvis. Over top of this, I then advanced a 6 [place] by 24 cm contour stent. String was removed. Good proximal and distal curls were seen once the guidewire was removed. The bladder was drained and the procedure terminated. The patient tolerated procedure well. She was awakened and taken to the PACU in stable condition.

## 2013-10-23 NOTE — Interval H&P Note (Signed)
History and Physical Interval Note:  10/23/2013 4:38 PM  Allison [name]  has presented today for surgery, with the diagnosis of Right midureteral stone with hydronephrosis  The various methods of treatment have been discussed with the patient and family. After consideration of risks, benefits and other options for treatment, the patient has consented to  Procedure(s): CYSTOSCOPY WITH RETROGRADE PYELOGRAM/URETERAL STENT PLACEMENT (Right) as a surgical intervention .  The patient's history has been reviewed, patient examined, no change in status, stable for surgery.  I have reviewed the patient's chart and labs.  Questions were answered to the patient's satisfaction.     [name]
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
## 2013-10-23 NOTE — Progress Notes (Signed)
Called son, Gaylyn Rong. Notified of pt status. Voiced understanding.

## 2013-10-23 NOTE — Progress Notes (Signed)
Patient admitted by Dr. Darrick Meigs earlier today.  Patient seen and examined. Case discussed with Dr. Diona Fanti  She has been admitted with abdominal pain, vomiting related to ureteral colic and a fairly large ureteral stone with associated hydronephrosis. Plans are for cystoscopy today with ureteral stent placement. She does not have evidence of UTI and has received a dose of rocephin.  Per urology, if the patient feels improved after the procedure, she can likely discharge home in the evening or in the AM.  [facility]

## 2013-10-23 NOTE — Anesthesia Postprocedure Evaluation (Signed)
  Anesthesia Post-op Note  Patient: Allison [name]  Procedure(s) Performed: Procedure(s): CYSTOSCOPY WITH RIGHT RETROGRADE PYELOGRAM AND RIGHT URETERAL STENT PLACEMENT (Right)  Patient Location: PACU  Anesthesia Type:General  Level of Consciousness: awake, alert , oriented and patient cooperative  Airway and Oxygen Therapy: Patient Spontanous Breathing  Post-op Pain: 2 /10, mild  Post-op Assessment: Post-op Vital signs reviewed, Patient's Cardiovascular Status Stable, Respiratory Function Stable, Patent Airway and Pain level controlled  Post-op Vital Signs: Reviewed and stable  Last Vitals:  Filed Vitals:   10/23/13 1640  BP: 181/103  Pulse:   Temp:   Resp: 15    Complications: No apparent anesthesia complications

## 2013-10-24 ENCOUNTER — Encounter (HOSPITAL_COMMUNITY): Payer: Self-pay | Admitting: Urology

## 2013-10-24 DIAGNOSIS — N201 Calculus of ureter: Principal | ICD-10-CM

## 2013-10-24 LAB — URINE CULTURE: Colony Count: 60000

## 2013-10-24 MED ORDER — KETOROLAC TROMETHAMINE 10 MG PO TABS: 10.0000 mg | ORAL_TABLET | Freq: Four times a day (QID) | ORAL | Status: DC | PRN

## 2013-10-24 NOTE — Discharge Summary (Signed)
Physician Discharge Summary  Allison [name] QIW:[id number] DOB: 1940/11/24 DOA: 10/22/2013  PCP: Tula Nakayama, MD  Admit date: 10/22/2013 Discharge date: 10/24/2013  Time spent: 35 minutes  Recommendations for Outpatient Follow-up:  1. Follow up with PCP in 1-2 weeks 2. Follow up with Dr. Diona Fanti as scheduled  Discharge Diagnoses:  Active Problems:   HYPERLIPIDEMIA   OBESITY   Ureteral colic   Ureteral stone   Calculus of ureter   Discharge Condition: Improved  Diet recommendation: Regular  Filed Weights   10/22/13 2016  Weight: 81.647 kg (180 lb)    History of present illness:  See admit h and p from 9/29 for details. Briefly, pt presents with abd pain, found to have an 50mm R ureteral stone. Pt was admitted for further work up.  Hospital Course:  The patient was admitted to the floor. Urology was consulted. The patient underwent cystoscopy on 9/29 with no reported complications. Pt reports feeling much improved by the following morning and is eager to go home. Pt medically stable for discharge with close outpatient follow up.  Procedures:  Cystoscopy 10/23/13  Consultations:  Urology - Dr. Diona Fanti  Discharge Exam: Filed Vitals:   10/23/13 1952 10/23/13 2035 10/23/13 2141 10/24/13 0600  BP: 166/96 159/93 152/76 132/71  Pulse: 76 75 81 78  Temp: 97.8 F (36.6 C) 97.6 F (36.4 C) 98.2 F (36.8 C) 99.5 F (37.5 C)  TempSrc: Oral Oral Oral Oral  Resp: 20 20 20 18   Height:      Weight:      SpO2: 95% 96% 94% 96%    General: Awake, in nad Cardiovascular: Regular, s1, s2 Respiratory: normal resp effort, no wheezing  Discharge Instructions     Medication List         cephALEXin 250 MG capsule  Commonly known as:  KEFLEX  Take 1 capsule (250 mg total) by mouth 2 (two) times daily.     ketorolac 10 MG tablet  Commonly known as:  TORADOL  Take 1 tablet (10 mg total) by mouth every 6 (six) hours as needed for severe pain.     oxybutynin 5 MG  tablet  Commonly known as:  DITROPAN  Take 1 tablet (5 mg total) by mouth 3 (three) times daily.     VITAMIN D PO  Take 1 tablet by mouth daily.       No Known Allergies Follow-up Information   Follow up with [name], MD. (we'll call you)    Specialty:  Urology   Contact information:   [place] [address] [phone]        The results of significant diagnostics from this hospitalization (including imaging, microbiology, ancillary and laboratory) are listed below for reference.    Significant Diagnostic Studies: Dg Chest 2 View  10/23/2013   CLINICAL DATA:  Right abdominal pain, vomiting, weakness, fatigue  EXAM: CHEST  2 VIEW  COMPARISON:  None.  FINDINGS: Chronic interstitial markings/emphysematous changes. No focal consolidation. No pleural effusion or pneumothorax.  Cardiomegaly.  Degenerative changes of the visualized thoracolumbar spine.  IMPRESSION: No evidence of acute cardiopulmonary disease.   Electronically Signed   By: Julian Hy M.D.   On: 10/23/2013 01:29   Ct Abdomen Pelvis W Contrast  10/22/2013   CLINICAL DATA:  Patient complains of right upper quadrant pain with nausea, vomiting, and constipation.  EXAM: CT ABDOMEN AND PELVIS WITH CONTRAST  TECHNIQUE: Multidetector CT imaging of the abdomen and pelvis was performed using  the standard protocol following bolus administration of intravenous contrast.  CONTRAST:  80mL OMNIPAQUE IOHEXOL 300 MG/ML SOLN, 138mL OMNIPAQUE IOHEXOL 300 MG/ML SOLN  COMPARISON:  06/01/2001  FINDINGS: Atelectasis in the lung bases.  Large esophageal hiatal hernia.  The liver, spleen, pancreas, adrenal glands, abdominal aorta, and inferior vena cava are unremarkable. Gallbladder is not visualized consistent with surgical absence. Mildly prominent lymph nodes in the porta hepatis region measuring up to about 10 mm, likely reactive. The right kidney demonstrates a delayed nephrogram with mild hydronephrosis and  hydroureter down to the level of the L4-5 interspace, were there is an 8 mm stone. The distal ureter is decompressed. Left kidney is unremarkable. Small bowel appear normal for degree of distention. Stool-filled colon without distention or wall thickening. No free air or free fluid in the abdomen.  Pelvis: Nodular enlargement of the uterus consistent with fibroid. Probable cysts in both ovaries, likely functional. Bladder wall is not thickened. No free or loculated pelvic fluid collections. Appendix is not identified. Scattered diverticula in the sigmoid colon without evidence of diverticulitis. Mild degenerative changes in the spine. Slight anterior subluxation at L5-S1 is likely degenerative.  IMPRESSION: 8 mm stone in the mid right ureter with moderate proximal obstruction. Large esophageal hiatal hernia. Uterine fibroid. Functional cysts in the ovaries.   Electronically Signed   By: Lucienne Capers M.D.   On: 10/22/2013 23:16   Dg C-arm 1-60 Min-no Report  10/23/2013   CLINICAL DATA: r ureteral stone   C-ARM 1-60 MINUTES  Fluoroscopy was utilized by the requesting physician.  No radiographic  interpretation.     Microbiology: Recent Results (from the past 240 hour(s))  SURGICAL PCR SCREEN     Status: None   Collection Time    10/23/13  4:06 PM      Result Value Ref Range Status   MRSA, PCR NEGATIVE  NEGATIVE Final   Staphylococcus aureus NEGATIVE  NEGATIVE Final   Comment:            The Xpert SA Assay (FDA     approved for NASAL specimens     in patients over 70 years of age),     is one component of     a comprehensive surveillance     program.  Test performance has     been validated by [employer] for patients greater     than or equal to 68 year old.     It is not intended     to diagnose infection nor to     guide or monitor treatment.     Labs: Basic Metabolic Panel:  Recent Labs Lab 10/22/13 2127 10/23/13 0643  NA 140 139  K 3.7 3.9  CL 102 103  CO2 25 26   GLUCOSE 144* 104*  BUN 13 12  CREATININE 1.03 0.95  CALCIUM 8.9 8.4   Liver Function Tests:  Recent Labs Lab 10/22/13 2127 10/23/13 0643  AST 16 14  ALT 14 13  ALKPHOS 81 75  BILITOT 0.6 0.8  PROT 8.6* 7.8  ALBUMIN 4.0 3.5    Recent Labs Lab 10/22/13 2127  LIPASE 11   No results found for this basename: AMMONIA,  in the last 168 hours CBC:  Recent Labs Lab 10/22/13 2127 10/23/13 0643  WBC 11.9* 11.8*  NEUTROABS 8.9*  --   HGB 13.6 12.7  HCT 40.6 37.7  MCV 88.3 87.7  PLT 338 341   Cardiac Enzymes:  Recent Labs  Lab 10/22/13 2127  TROPONINI <0.30   BNP: BNP (last 3 results) No results found for this basename: PROBNP,  in the last 8760 hours CBG: No results found for this basename: GLUCAP,  in the last 168 hours  Signed:  CHIU, STEPHEN K  [facility] 10/24/2013, 10:48 AM

## 2013-10-24 NOTE — Addendum Note (Signed)
Addendum created 10/24/13 0957 by Ollen Bowl, CRNA   Modules edited: Notes Section   Notes Section:  File: [id number]

## 2013-10-24 NOTE — Care Management Note (Signed)
    Page 1 of 1   10/24/2013     2:40:24 PM CARE MANAGEMENT NOTE 10/24/2013  Patient:  Allison [name], Allison [name]   Account Number:  [id number]  Date Initiated:  10/24/2013  Documentation initiated by:  Vladimir Creeks  Subjective/Objective Assessment:   Admitted with kidney colic. 49mm stone. Pt is from home, with spouse, is independent,   and will return home at D/C     Action/Plan:   no needs identified   Anticipated DC Date:  10/24/2013   Anticipated DC Plan:  [place]  CM consult      Choice offered to / List presented to:             Status of service:  Completed, signed off Medicare Important Message given?   (If response is "NO", the following Medicare IM given date fields will be blank) Date Medicare IM given:   Medicare IM given by:   Date Additional Medicare IM given:   Additional Medicare IM given by:    Discharge Disposition:  HOME/SELF CARE  Per UR Regulation:  Reviewed for med. necessity/level of care/duration of stay  If discussed at [facility] of Stay Meetings, dates discussed:    Comments:  10/24/13 Bell RN/CM

## 2013-10-24 NOTE — Anesthesia Postprocedure Evaluation (Signed)
  Anesthesia Post-op Note  Patient: Allison [name]  Procedure(s) Performed: Procedure(s): CYSTOSCOPY WITH RIGHT RETROGRADE PYELOGRAM AND RIGHT URETERAL STENT PLACEMENT (Right)  Patient Location: Nursing Unit  Anesthesia Type:General  Level of Consciousness: awake, alert  and oriented  Airway and Oxygen Therapy: Patient Spontanous Breathing  Post-op Pain: none  Post-op Assessment: Post-op Vital signs reviewed, Patient's Cardiovascular Status Stable, Respiratory Function Stable, Patent Airway and No signs of Nausea or vomiting  Post-op Vital Signs: Reviewed and stable  Last Vitals:  Filed Vitals:   10/24/13 0600  BP: 132/71  Pulse: 78  Temp: 37.5 C  Resp: 18    Complications: No apparent anesthesia complications

## 2013-11-05 ENCOUNTER — Ambulatory Visit: Payer: Medicare Other | Admitting: Family Medicine

## 2013-11-05 ENCOUNTER — Encounter: Payer: Self-pay | Admitting: Family Medicine

## 2013-11-05 ENCOUNTER — Ambulatory Visit (INDEPENDENT_AMBULATORY_CARE_PROVIDER_SITE_OTHER): Payer: Medicare Other | Admitting: Family Medicine

## 2013-11-05 VITALS — BP 130/80 | HR 72 | Resp 18 | Ht 63.5 in | Wt 196.1 lb

## 2013-11-05 DIAGNOSIS — I10 Essential (primary) hypertension: Secondary | ICD-10-CM

## 2013-11-05 DIAGNOSIS — Z1231 Encounter for screening mammogram for malignant neoplasm of breast: Secondary | ICD-10-CM

## 2013-11-05 DIAGNOSIS — R7301 Impaired fasting glucose: Secondary | ICD-10-CM

## 2013-11-05 DIAGNOSIS — N201 Calculus of ureter: Secondary | ICD-10-CM

## 2013-11-05 DIAGNOSIS — E785 Hyperlipidemia, unspecified: Secondary | ICD-10-CM

## 2013-11-05 NOTE — Patient Instructions (Signed)
F/u as before, call if you need me  Flu vaccine next visit per your request  PLS bring all meds to next visit, current list is inaccurate  HBa1C, cmp,  Lipid fasting in Nov  We will refer you for a mammogram

## 2013-11-05 NOTE — Progress Notes (Signed)
   Subjective:    Patient ID: Allison [name], female    DOB: November 15, 1940, 73 y.o.   MRN: [id number]  HPI Pt in for f/u recent hospitalization for renal stone , no longer experiencing pain  or hematuria, but has upcoming procedure planned for the problem First episode of renal colic Denies any other concerns, wants to delay flu vaccine till next visit and will bring in all meds for review at that visit. Mammogram past due , she agrees to have this done    Review of Systems See HPI Denies recent fever or chills. Denies sinus pressure, nasal congestion, ear pain or sore throat. Denies chest congestion, productive cough or wheezing. Denies chest pains, palpitations and leg swelling Denies abdominal pain, nausea, vomiting,diarrhea or constipation.   Denies dysuria, frequency, hesitancy or incontinence. Denies joint pain, swelling and limitation in mobility. Denies headaches, seizures, numbness, or tingling. Denies depression, anxiety or insomnia. Denies skin break down or rash.        Objective:   Physical Exam  BP 130/80  Pulse 72  Resp 18  Ht 5' 3.5" (1.613 m)  Wt 196 lb 1.9 oz (88.959 kg)  BMI 34.19 kg/m2  SpO2 97% Patient alert and oriented and in no cardiopulmonary distress.  HEENT: No facial asymmetry, EOMI,   oropharynx pink and moist.  Neck supple no JVD, no mass.  Chest: Clear to auscultation bilaterally.  CVS: S1, S2 no murmurs, no S3.Regular rate.  ABD: Soft non tender.   Ext: No edema  MS: Adequate ROM spine, shoulders, hips and knees.  Skin: Intact, no ulcerations or rash noted.  Psych: Good eye contact, normal affect. Memory intact not anxious or depressed appearing.  CNS: CN 2-12 intact, power,  normal throughout.no focal deficits noted.       Assessment & Plan:  HTN (hypertension) Controlled, no change in medication DASH diet and commitment to daily physical activity for a minimum of 30 minutes discussed and encouraged, as a part of  hypertension management. The importance of attaining a healthy weight is also discussed. Needs to bring in meds to update list accurately  Calculus of ureter Currently asymptomatic, denies pain, por hematuria, will ;likely have lithotripsy based on urology note  Hyperlipidemia LDL goal <100 Uncontrolled , due to elevated lDL Hyperlipidemia:Low fat diet discussed and encouraged.  Updated lab needed at/ before next visit. Need to bring meds to update list  Impaired fasting glucose Patient educated about the importance of limiting  Carbohydrate intake , the need to commit to daily physical activity for a minimum of 30 minutes , and to commit weight loss. The fact that changes in all these areas will reduce or eliminate all together the development of diabetes is stressed.   Updated lab needed at/ before next visit.

## 2013-11-05 NOTE — Assessment & Plan Note (Signed)
Controlled, no change in medication DASH diet and commitment to daily physical activity for a minimum of 30 minutes discussed and encouraged, as a part of hypertension management. The importance of attaining a healthy weight is also discussed. Needs to bring in meds to update list accurately

## 2013-11-05 NOTE — Assessment & Plan Note (Signed)
Patient educated about the importance of limiting  Carbohydrate intake , the need to commit to daily physical activity for a minimum of 30 minutes , and to commit weight loss. The fact that changes in all these areas will reduce or eliminate all together the development of diabetes is stressed.   Updated lab needed at/ before next visit.  

## 2013-11-05 NOTE — Assessment & Plan Note (Signed)
Uncontrolled , due to elevated lDL Hyperlipidemia:Low fat diet discussed and encouraged.  Updated lab needed at/ before next visit. Need to bring meds to update list

## 2013-11-05 NOTE — Assessment & Plan Note (Signed)
Currently asymptomatic, denies pain, por hematuria, will ;likely have lithotripsy based on urology note

## 2013-11-15 ENCOUNTER — Other Ambulatory Visit: Payer: Self-pay | Admitting: Urology

## 2013-11-15 ENCOUNTER — Ambulatory Visit (HOSPITAL_COMMUNITY): Payer: Medicare Other

## 2013-11-22 ENCOUNTER — Ambulatory Visit (HOSPITAL_COMMUNITY)
Admission: RE | Admit: 2013-11-22 | Discharge: 2013-11-22 | Disposition: A | Discharge status: Home / Self Care | Payer: Medicare Other | Source: Ambulatory Visit | Attending: Family Medicine | Admitting: Family Medicine

## 2013-11-22 DIAGNOSIS — Z1231 Encounter for screening mammogram for malignant neoplasm of breast: Secondary | ICD-10-CM | POA: Insufficient documentation

## 2013-11-23 ENCOUNTER — Encounter (HOSPITAL_COMMUNITY): Payer: Self-pay | Admitting: Pharmacy Technician

## 2013-11-26 ENCOUNTER — Encounter: Payer: Self-pay | Admitting: Family Medicine

## 2013-11-27 ENCOUNTER — Encounter (HOSPITAL_COMMUNITY)
Admission: RE | Admit: 2013-11-27 | Discharge: 2013-11-27 | Disposition: A | Discharge status: Home / Self Care | Payer: Medicare Other | Source: Ambulatory Visit | Attending: Urology | Admitting: Urology

## 2013-11-27 ENCOUNTER — Encounter (HOSPITAL_COMMUNITY): Payer: Self-pay

## 2013-11-27 DIAGNOSIS — Z01812 Encounter for preprocedural laboratory examination: Secondary | ICD-10-CM | POA: Insufficient documentation

## 2013-11-27 LAB — BASIC METABOLIC PANEL
ANION GAP: 12 (ref 5–15)
BUN: 13 mg/dL (ref 6–23)
CO2: 26 mEq/L (ref 19–32)
Calcium: 9.2 mg/dL (ref 8.4–10.5)
Chloride: 105 mEq/L (ref 96–112)
Creatinine, Ser: 0.91 mg/dL (ref 0.50–1.10)
GFR calc Af Amer: 70 mL/min — ABNORMAL LOW (ref >90)
GFR, EST NON AFRICAN AMERICAN: 60 mL/min — AB (ref >90)
Glucose, Bld: 103 mg/dL — ABNORMAL HIGH (ref 70–99)
POTASSIUM: 4 meq/L (ref 3.7–5.3)
SODIUM: 143 meq/L (ref 137–147)

## 2013-11-27 LAB — HEMOGLOBIN AND HEMATOCRIT, BLOOD
HEMATOCRIT: 38.8 % (ref 36.0–46.0)
Hemoglobin: 12.9 g/dL (ref 12.0–15.0)

## 2013-11-27 NOTE — Pre-Procedure Instructions (Signed)
Patient given information to sign up for my chart at home. 

## 2013-11-27 NOTE — Patient Instructions (Signed)
Allison [name]  11/27/2013   Your procedure is scheduled on:   12/04/2013  Report to [facility] at  79  AM.  Call this number if you have problems the morning of surgery: [phone]   Remember:   Do not eat food or drink liquids after midnight.   Take these medicines the morning of surgery with A SIP OF WATER:  Norvasc, ditropan   Do not wear jewelry, make-up or nail polish.  Do not wear lotions, powders, or perfumes.   Do not shave 48 hours prior to surgery. Men may shave face and neck.  Do not bring valuables to the hospital.  [facility] is not responsible for any belongings or valuables.               Contacts, dentures or bridgework may not be worn into surgery.  Leave suitcase in the car. After surgery it may be brought to your room.  For patients admitted to the hospital, discharge time is determined by your treatment team.               Patients discharged the day of surgery will not be allowed to drive home.  Name and phone number of your driver: family  Special Instructions: Shower using CHG 2 nights before surgery and the night before surgery.  If you shower the day of surgery use CHG.  Use special wash - you have one bottle of CHG for all showers.  You should use approximately 1/3 of the bottle for each shower.   Please read over the following fact sheets that you were given: Pain Booklet, Coughing and Deep Breathing, Surgical Site Infection Prevention, Anesthesia Post-op Instructions and Care and Recovery After Surgery Cystoscopy Cystoscopy is a procedure that is used to help your caregiver diagnose and sometimes treat conditions that affect your lower urinary tract. Your lower urinary tract includes your bladder and the tube through which urine passes from your bladder out of your body (urethra). Cystoscopy is performed with a thin, tube-shaped instrument (cystoscope). The cystoscope has lenses and a light at the end so that your caregiver can see inside  your bladder. The cystoscope is inserted at the entrance of your urethra. Your caregiver guides it through your urethra and into your bladder. There are two main types of cystoscopy:  Flexible cystoscopy (with a flexible cystoscope).  Rigid cystoscopy (with a rigid cystoscope). Cystoscopy may be recommended for many conditions, including:  Urinary tract infections.  Blood in your urine (hematuria).  Loss of bladder control (urinary incontinence) or overactive bladder.  Unusual cells found in a urine sample.  Urinary blockage.  Painful urination. Cystoscopy may also be done to remove a sample of your tissue to be checked under a microscope (biopsy). It may also be done to remove or destroy bladder stones. LET YOUR CAREGIVER KNOW ABOUT:  Allergies to food or medicine.  Medicines taken, including vitamins, herbs, eyedrops, over-the-counter medicines, and creams.  Use of steroids (by mouth or creams).  Previous problems with anesthetics or numbing medicines.  History of bleeding problems or blood clots.  Previous surgery.  Other health problems, including diabetes and kidney problems.  Possibility of pregnancy, if this applies. PROCEDURE The area around the opening to your urethra will be cleaned. A medicine to numb your urethra (local anesthetic) is used. If a tissue sample or stone is removed during the procedure, you may be given a medicine to make you sleep (general  anesthetic). Your caregiver will gently insert the tip of the cystoscope into your urethra. The cystoscope will be slowly glided through your urethra and into your bladder. Sterile fluid will flow through the cystoscope and into your bladder. The fluid will expand and stretch your bladder. This gives your caregiver a better view of your bladder walls. The procedure lasts about 15-20 minutes. AFTER THE PROCEDURE If a local anesthetic is used, you will be allowed to go home as soon as you are ready. If a general  anesthetic is used, you will be taken to a recovery area until you are stable. You may have temporary bleeding and burning on urination. Document Released: 01/09/2000 Document Revised: 10/06/2011 Document Reviewed: 07/05/2011 [facility] Patient Information 2015 [place], [place]. This information is not intended to replace advice given to you by your health care provider. Make sure you discuss any questions you have with your health care provider. PATIENT INSTRUCTIONS POST-ANESTHESIA  IMMEDIATELY FOLLOWING SURGERY:  Do not drive or operate machinery for the first twenty four hours after surgery.  Do not make any important decisions for twenty four hours after surgery or while taking narcotic pain medications or sedatives.  If you develop intractable nausea and vomiting or a severe headache please notify your doctor immediately.  FOLLOW-UP:  Please make an appointment with your surgeon as instructed. You do not need to follow up with anesthesia unless specifically instructed to do so.  WOUND CARE INSTRUCTIONS (if applicable):  Keep a dry clean dressing on the anesthesia/puncture wound site if there is drainage.  Once the wound has quit draining you may leave it open to air.  Generally you should leave the bandage intact for twenty four hours unless there is drainage.  If the epidural site drains for more than 36-48 hours please call the anesthesia department.  QUESTIONS?:  Please feel free to call your physician or the hospital operator if you have any questions, and they will be happy to assist you.      Marland Kitchen

## 2013-11-28 ENCOUNTER — Other Ambulatory Visit (HOSPITAL_COMMUNITY): Payer: Medicare Other

## 2013-11-29 ENCOUNTER — Inpatient Hospital Stay (HOSPITAL_COMMUNITY): Admission: RE | Admit: 2013-11-29 | Payer: Medicare Other | Source: Ambulatory Visit
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
## 2013-12-04 ENCOUNTER — Encounter (HOSPITAL_COMMUNITY): Payer: Self-pay | Admitting: *Deleted

## 2013-12-04 ENCOUNTER — Ambulatory Visit (HOSPITAL_COMMUNITY): Payer: Medicare Other

## 2013-12-04 ENCOUNTER — Ambulatory Visit (HOSPITAL_COMMUNITY)
Admission: RE | Admit: 2013-12-04 | Discharge: 2013-12-04 | Disposition: A | Discharge status: Home / Self Care | Payer: Medicare Other | Source: Ambulatory Visit | Attending: Urology | Admitting: Urology

## 2013-12-04 ENCOUNTER — Encounter (HOSPITAL_COMMUNITY)
Admission: RE | Disposition: A | Discharge status: Home / Self Care | Payer: Self-pay | Source: Ambulatory Visit | Attending: Urology

## 2013-12-04 ENCOUNTER — Ambulatory Visit (HOSPITAL_COMMUNITY): Payer: Medicare Other | Admitting: Anesthesiology

## 2013-12-04 DIAGNOSIS — N202 Calculus of kidney with calculus of ureter: Secondary | ICD-10-CM | POA: Diagnosis not present

## 2013-12-04 DIAGNOSIS — N201 Calculus of ureter: Secondary | ICD-10-CM

## 2013-12-04 DIAGNOSIS — E559 Vitamin D deficiency, unspecified: Secondary | ICD-10-CM | POA: Diagnosis not present

## 2013-12-04 DIAGNOSIS — N950 Postmenopausal bleeding: Secondary | ICD-10-CM | POA: Diagnosis not present

## 2013-12-04 DIAGNOSIS — Z6834 Body mass index (BMI) 34.0-34.9, adult: Secondary | ICD-10-CM | POA: Diagnosis not present

## 2013-12-04 DIAGNOSIS — E669 Obesity, unspecified: Secondary | ICD-10-CM | POA: Diagnosis not present

## 2013-12-04 DIAGNOSIS — M8580 Other specified disorders of bone density and structure, unspecified site: Secondary | ICD-10-CM | POA: Diagnosis not present

## 2013-12-04 DIAGNOSIS — K219 Gastro-esophageal reflux disease without esophagitis: Secondary | ICD-10-CM | POA: Insufficient documentation

## 2013-12-04 DIAGNOSIS — I10 Essential (primary) hypertension: Secondary | ICD-10-CM | POA: Diagnosis not present

## 2013-12-04 DIAGNOSIS — E785 Hyperlipidemia, unspecified: Secondary | ICD-10-CM | POA: Diagnosis not present

## 2013-12-04 HISTORY — PX: HOLMIUM LASER APPLICATION: SHX5852

## 2013-12-04 HISTORY — PX: URETEROSCOPY: SHX842

## 2013-12-04 HISTORY — PX: CYSTOSCOPY W/ RETROGRADES: SHX1426

## 2013-12-04 SURGERY — CYSTOSCOPY, WITH RETROGRADE PYELOGRAM
Anesthesia: General | Laterality: Right

## 2013-12-04 MED ORDER — EPHEDRINE SULFATE 50 MG/ML IJ SOLN
INTRAMUSCULAR | Status: DC | PRN
  Administered 2013-12-04: 5 mg via INTRAVENOUS

## 2013-12-04 MED ORDER — CEFAZOLIN SODIUM 1-5 GM-% IV SOLN
1.0000 g | INTRAVENOUS | Status: AC
  Administered 2013-12-04: 1 g via INTRAVENOUS

## 2013-12-04 MED ORDER — GLYCOPYRROLATE 0.2 MG/ML IJ SOLN
INTRAMUSCULAR | Status: DC | PRN
  Administered 2013-12-04: .4 mg via INTRAVENOUS

## 2013-12-04 MED ORDER — SODIUM CHLORIDE 0.9 % IJ SOLN
INTRAMUSCULAR | Status: AC
  Filled 2013-12-04: qty 10

## 2013-12-04 MED ORDER — ROCURONIUM BROMIDE 100 MG/10ML IV SOLN
INTRAVENOUS | Status: DC | PRN
  Administered 2013-12-04: 20 mg via INTRAVENOUS
  Administered 2013-12-04: 5 mg via INTRAVENOUS

## 2013-12-04 MED ORDER — NEOSTIGMINE METHYLSULFATE 10 MG/10ML IV SOLN
INTRAVENOUS | Status: DC | PRN
Start: 2013-12-04 — End: 2013-12-04
  Administered 2013-12-04: 2 mg via INTRAVENOUS

## 2013-12-04 MED ORDER — CEFAZOLIN SODIUM 1-5 GM-% IV SOLN
INTRAVENOUS | Status: AC
  Filled 2013-12-04: qty 50

## 2013-12-04 MED ORDER — FENTANYL CITRATE 0.05 MG/ML IJ SOLN
INTRAMUSCULAR | Status: AC
  Filled 2013-12-04: qty 5

## 2013-12-04 MED ORDER — FENTANYL CITRATE 0.05 MG/ML IJ SOLN
25.0000 ug | INTRAMUSCULAR | Status: AC
  Administered 2013-12-04 (×2): 25 ug via INTRAVENOUS
  Filled 2013-12-04: qty 2

## 2013-12-04 MED ORDER — SODIUM CHLORIDE 0.9 % IR SOLN
Status: DC | PRN
  Administered 2013-12-04: 3000 mL

## 2013-12-04 MED ORDER — LACTATED RINGERS IV SOLN
INTRAVENOUS | Status: DC
  Administered 2013-12-04: 11:00:00 via INTRAVENOUS

## 2013-12-04 MED ORDER — GLYCOPYRROLATE 0.2 MG/ML IJ SOLN
INTRAMUSCULAR | Status: AC
  Filled 2013-12-04: qty 3

## 2013-12-04 MED ORDER — SUCCINYLCHOLINE CHLORIDE 20 MG/ML IJ SOLN
INTRAMUSCULAR | Status: AC
  Filled 2013-12-04: qty 1

## 2013-12-04 MED ORDER — LIDOCAINE HCL (PF) 1 % IJ SOLN
INTRAMUSCULAR | Status: AC
  Filled 2013-12-04: qty 5

## 2013-12-04 MED ORDER — NEOSTIGMINE METHYLSULFATE 10 MG/10ML IV SOLN
INTRAVENOUS | Status: AC
  Filled 2013-12-04: qty 1

## 2013-12-04 MED ORDER — EPHEDRINE SULFATE 50 MG/ML IJ SOLN
INTRAMUSCULAR | Status: AC
  Filled 2013-12-04: qty 1

## 2013-12-04 MED ORDER — ROCURONIUM BROMIDE 50 MG/5ML IV SOLN
INTRAVENOUS | Status: AC
Start: 2013-12-04 — End: 2013-12-04
  Filled 2013-12-04: qty 1

## 2013-12-04 MED ORDER — FENTANYL CITRATE 0.05 MG/ML IJ SOLN
INTRAMUSCULAR | Status: DC | PRN
  Administered 2013-12-04: 25 ug via INTRAVENOUS
  Administered 2013-12-04: 50 ug via INTRAVENOUS

## 2013-12-04 MED ORDER — PROPOFOL 10 MG/ML IV BOLUS
INTRAVENOUS | Status: DC | PRN
  Administered 2013-12-04: 100 mg via INTRAVENOUS

## 2013-12-04 MED ORDER — CIPROFLOXACIN HCL 250 MG PO TABS: 250.0000 mg | ORAL_TABLET | Freq: Two times a day (BID) | ORAL | Status: DC

## 2013-12-04 MED ORDER — ONDANSETRON HCL 4 MG/2ML IJ SOLN
4.0000 mg | Freq: Once | INTRAMUSCULAR | Status: AC
  Administered 2013-12-04: 4 mg via INTRAVENOUS
  Filled 2013-12-04: qty 2

## 2013-12-04 MED ORDER — LIDOCAINE HCL (CARDIAC) 10 MG/ML IV SOLN
INTRAVENOUS | Status: DC | PRN
  Administered 2013-12-04: 20 mg via INTRAVENOUS

## 2013-12-04 MED ORDER — FENTANYL CITRATE 0.05 MG/ML IJ SOLN: 25.0000 ug | INTRAMUSCULAR | Status: DC | PRN

## 2013-12-04 MED ORDER — ONDANSETRON HCL 4 MG/2ML IJ SOLN: 4.0000 mg | Freq: Once | INTRAMUSCULAR | Status: DC | PRN

## 2013-12-04 MED ORDER — SUCCINYLCHOLINE CHLORIDE 20 MG/ML IJ SOLN
INTRAMUSCULAR | Status: DC | PRN
  Administered 2013-12-04: 100 mg via INTRAVENOUS

## 2013-12-04 MED ORDER — MIDAZOLAM HCL 2 MG/2ML IJ SOLN
1.0000 mg | INTRAMUSCULAR | Status: DC | PRN
  Administered 2013-12-04: 2 mg via INTRAVENOUS
  Filled 2013-12-04: qty 2

## 2013-12-04 MED ORDER — STERILE WATER FOR IRRIGATION IR SOLN
Status: DC | PRN
  Administered 2013-12-04 (×2): 3000 mL

## 2013-12-04 SURGICAL SUPPLY — 23 items
BAG DRAIN CYSTO-URO STER (DRAIN) ×3 IMPLANT
BASKET ZERO TIP NITINOL 2.4FR (BASKET) ×3 IMPLANT
CATH INTERMIT  6FR 70CM (CATHETERS) ×3 IMPLANT
CLOTH BEACON ORANGE TIMEOUT ST (SAFETY) ×3 IMPLANT
DECANTER SPIKE VIAL GLASS SM (MISCELLANEOUS) ×3 IMPLANT
GLOVE BIOGEL PI IND STRL 7.0 (GLOVE) ×1 IMPLANT
GLOVE BIOGEL PI INDICATOR 7.0 (GLOVE) ×2
GLOVE ECLIPSE 6.5 STRL STRAW (GLOVE) ×3 IMPLANT
GLOVE EXAM NITRILE MD LF STRL (GLOVE) ×3 IMPLANT
GLOVE SURG SS PI 8.0 STRL IVOR (GLOVE) ×3 IMPLANT
GOWN STRL REUS W/TWL LRG LVL3 (GOWN DISPOSABLE) ×3 IMPLANT
GOWN STRL REUS W/TWL XL LVL3 (GOWN DISPOSABLE) ×3 IMPLANT
GUIDEWIRE STR DUAL SENSOR (WIRE) ×3 IMPLANT
KIT CLEAN ENDO COMPLIANCE (KITS) ×3 IMPLANT
LASER FIBER DISP (UROLOGICAL SUPPLIES) ×3 IMPLANT
MANIFOLD NEPTUNE II (INSTRUMENTS) ×3 IMPLANT
PACK CYSTO (CUSTOM PROCEDURE TRAY) ×3 IMPLANT
PAD ARMBOARD 7.5X6 YLW CONV (MISCELLANEOUS) ×3 IMPLANT
SET IRRIGATING DISP (SET/KITS/TRAYS/PACK) ×3 IMPLANT
SHEATH URET ACCESS 12FR/35CM (UROLOGICAL SUPPLIES) ×3 IMPLANT
TOWEL OR 17X26 4PK STRL BLUE (TOWEL DISPOSABLE) ×3 IMPLANT
VALVE DISPOSABLE (MISCELLANEOUS) ×3 IMPLANT
WATER STERILE IRR 3000ML UROMA (IV SOLUTION) ×12 IMPLANT

## 2013-12-04 NOTE — Anesthesia Procedure Notes (Signed)
Procedure Name: Intubation Date/Time: 12/04/2013 12:20 PM Performed by: Tressie Stalker E Pre-anesthesia Checklist: Patient identified, Patient being monitored, Timeout performed, Emergency Drugs available and Suction available Patient Re-evaluated:Patient Re-evaluated prior to inductionOxygen Delivery Method: Circle System Utilized Preoxygenation: Pre-oxygenation with 100% oxygen Intubation Type: IV induction, Rapid sequence and Cricoid Pressure applied Ventilation: Mask ventilation without difficulty Laryngoscope Size: Mac and 3 Grade View: Grade I Tube type: Oral Tube size: 7.0 mm Number of attempts: 1 Airway Equipment and Method: stylet Placement Confirmation: ETT inserted through vocal cords under direct vision,  positive ETCO2 and breath sounds checked- equal and bilateral Secured at: 21 cm Tube secured with: Tape Dental Injury: Teeth and Oropharynx as per pre-operative assessment

## 2013-12-04 NOTE — Anesthesia Postprocedure Evaluation (Signed)
  Anesthesia Post-op Note  Patient: Allison [name]  Procedure(s) Performed: Procedure(s): CYSTOSCOPY WITH RIGHT RETROGRADE PYELOGRAM, REMOVAL OF RIGHT DOUBLE J URETERAL STENT, RIGHT DOUBLE J STENT PLACMENT (Right) RIGHT URETERAL PYELOSCOPY - RIGID AND FLEXIBLE  WITH STONE EXTRACTION (Right) HOLMIUM LASER OF RIGHT RENAL STONE (Right)  Patient Location: PACU  Anesthesia Type:General  Level of Consciousness: awake, alert  and oriented  Airway and Oxygen Therapy: Patient Spontanous Breathing and Patient connected to face mask oxygen  Post-op Pain: none  Post-op Assessment: Post-op Vital signs reviewed, Patient's Cardiovascular Status Stable, Respiratory Function Stable, Patent Airway and No signs of Nausea or vomiting  Post-op Vital Signs: Reviewed and stable  Last Vitals:  Filed Vitals:   12/04/13 1205  BP: 147/81  Pulse:   Temp:   Resp: 17    Complications: No apparent anesthesia complications

## 2013-12-04 NOTE — Discharge Instructions (Signed)
[facility] [phone] Post Ureteroscopy With or Without Stent Instructions  Definitions:  Ureter: The duct that transports urine from the kidney to the bladder. Stent:   A plastic hollow tube that is placed into the ureter, from the kidney to the bladder to prevent the ureter from swelling shut.  GENERAL INSTRUCTIONS:  Despite the fact that no skin incisions were used, the area around the ureter and bladder is raw and irritated. The stent is a foreign body which will further irritate the bladder wall. This irritation is manifested by increased frequency of urination, both day and night, and by an increase in the urge to urinate. In some, the urge to urinate is present almost always. Sometimes the urge is strong enough that you may not be able to stop yourself from urinating. The only real cure is to remove the stent and then give time for the bladder wall to heal which can't be done until the danger of the ureter swelling shut has passed, which varies.  You may see some blood in your urine while the stent is in place and a few days afterwards. Do not be alarmed, even if the urine was clear for a while. Get off your feet and drink lots of fluids until clearing occurs. If you start to pass clots or don't improve, call us.  Pull the string to remove the stent on Friday morning.  DIET: You may return to your normal diet immediately. Because of the raw surface of your bladder, alcohol, spicy foods, acid type foods and drinks with caffeine may cause irritation or frequency and should be used in moderation. To keep your urine flowing freely and to avoid constipation, drink plenty of fluids during the day ( 8-10 glasses ). Tip: Avoid cranberry juice because it is very acidic.  ACTIVITY: Your physical activity doesn't need to be restricted. However, if you are very active, you may see some blood in your urine. We suggest that you reduce your activity under these circumstances until  the bleeding has stopped.  BOWELS: It is important to keep your bowels regular during the postoperative period. Straining with bowel movements can cause bleeding. A bowel movement every other day is reasonable. Use a mild laxative if needed, such as Milk of Magnesia 2-3 tablespoons, or 2 Dulcolax tablets. Call if you continue to have problems. If you have been taking narcotics for pain, before, during or after your surgery, you may be constipated. Take a laxative if necessary.   MEDICATION: You should resume your pre-surgery medications unless told not to. In addition you will often be given an antibiotic to prevent infection. These should be taken as prescribed until the bottles are finished unless you are having an unusual reaction to one of the drugs.  PROBLEMS YOU SHOULD REPORT TO [place]:  Fevers over 100.5 Fahrenheit.  Heavy bleeding, or clots ( See above notes about blood in urine ).  Inability to urinate.  Drug reactions ( hives, rash, nausea, vomiting, diarrhea ).  Severe burning or pain with urination that is not improving.  FOLLOW-UP: You will need a follow-up appointment to monitor your progress. Call for this appointment at the number listed above. Usually the first appointment will be about three to fourteen days after your surgery.

## 2013-12-04 NOTE — Op Note (Signed)
PATIENT:  Allison [name]  PRE-OPERATIVE DIAGNOSIS: right midureteral calculus  POST-OPERATIVE DIAGNOSIS: right renal calculus  PROCEDURE: cystoscopy, extraction of right double-J stent, right ureteral pyeloscopy-rigid and flexible, holmium laser of right renal stone, extraction of fragments, right double-J stent placement  SURGEON:  Lillette Boxer. Wilson Dusenbery, M.D.  ANESTHESIA:  General  EBL:  Minimal  DRAINS: 6 French, 24 cm contour double-J stent with string  LOCAL MEDICATIONS USED:  None  SPECIMEN:  Stone fragments, the patient's family/friend  INDICATION: Allison [name] is a 73 year old female who has a right ureteral stent is management for an infected, obstructing right ureteral stone. Her stent placement was about a month ago. The patient has chosen ureteroscopy over lithotripsy, and presents at this time for ureteroscopic management of that right ureteral stone. Risks and complications of the procedure have been discussed with the patient. She understands, and desires to proceed.  Description of procedure: The patient was properly identified and marked (if applicable) in the holding area. They were then  taken to the operating room and placed on the table in a supine position. General anesthesia was then administered. Once fully anesthetized the patient was moved to the dorsolithotomy position and the genitalia and perineum were sterilely prepped and draped in standard fashion. An official timeout was then performed.  A 22 French panendoscope was advanced under direct vision into her bladder. The bladder was inspected circumferentially. There were no tumors, trabeculations or foreign bodies. Ureteral orifices were normal in configuration and location. A double-J stent was present at the right ureteral orifice. This was grasped with biopsy forceps and brought through the patient's urethra. A sensor-tip guidewire was then advanced through the stent, up into the right renal pelvis using  fluoroscopic guidance. The stent was then removed over the guidewire. I then passed a 6 French rigid ureteroscope through the ureter. I was unable to visualize the stone, which I felt probably resided within the patient's kidney from prior stent placement.  I then, over the center tip guidewire, passed a 12/14 French ureteral access sheath. The core was removed as well as a sensor-tip guidewire. The flexible ureteroscope was passed through this. The entire pyelocalyceal system was inspected. The stone was seen in the interpolar calyces. It was grasped, and brought into the renal pelvis. At this point, I removed the Nitinol basket, and passed a 200  fiber. Holmium laser energy was applied to fragment the stone into multiple smaller fragments which I felt would be adequate for extraction. Total energy used was 0.45 kJ. A total of 7.5 W of power was used. Laser was turned on at 1253, off it 1308.  At this point, the fragments were all extracted through the ureteral access sheath using the Nitinol basket. Careful inspection of the pyelocalyceal systems following this revealed no evident fragments remaining. Because of the amount of instrumentation, I felt that placement of a stent would be necessary. I then passed, over the guidewire, using a cystoscope, it 24 cm x 6 French contour stent, leaving the string on. The bladder was drained, the procedure terminated. The string was taped to the patient's thigh. She was awakened and taken to PACU in stable condition, having tolerated the procedure well.    PLAN OF CARE: Discharge to home after PACU  PATIENT DISPOSITION:  PACU - hemodynamically stable.

## 2013-12-04 NOTE — Transfer of Care (Signed)
Immediate Anesthesia Transfer of Care Note  Patient: Allison [name]  Procedure(s) Performed: Procedure(s): CYSTOSCOPY WITH RIGHT RETROGRADE PYELOGRAM, REMOVAL OF RIGHT DOUBLE J URETERAL STENT, RIGHT DOUBLE J STENT PLACMENT (Right) RIGHT URETERAL PYELOSCOPY - RIGID AND FLEXIBLE  WITH STONE EXTRACTION (Right) HOLMIUM LASER OF RIGHT RENAL STONE (Right)  Patient Location: PACU  Anesthesia Type:General  Level of Consciousness: awake and alert   Airway & Oxygen Therapy: Patient Spontanous Breathing and Patient connected to face mask oxygen  Post-op Assessment: Report given to PACU RN  Post vital signs: Reviewed and stable  Complications: No apparent anesthesia complications

## 2013-12-04 NOTE — Anesthesia Preprocedure Evaluation (Addendum)
Anesthesia Evaluation  Patient identified by MRN, date of birth, ID band Patient awake    Reviewed: Allergy & Precautions, H&P , NPO status , Patient's Chart, lab work & pertinent test results  Airway Mallampati: I  TM Distance: >3 FB     Dental  (+) Edentulous Upper, Poor Dentition, Loose, Missing,    Pulmonary shortness of breath and with exertion,    + decreased breath sounds      Cardiovascular Exercise Tolerance: Good hypertension, Pt. on medications Rhythm:Regular Rate:Normal     Neuro/Psych    GI/Hepatic GERD-  Medicated and Controlled,  Endo/Other  Morbid obesity  Renal/GU      Musculoskeletal   Abdominal (+) + obese,  Abdomen: soft. Bowel sounds: normal.  Peds  Hematology   Anesthesia Other Findings   Reproductive/Obstetrics                            Anesthesia Physical Anesthesia Plan  ASA: III  Anesthesia Plan: General   Post-op Pain Management:    Induction: Intravenous  Airway Management Planned: Oral ETT  Additional Equipment:   Intra-op Plan:   Post-operative Plan: Extubation in OR  Informed Consent: I have reviewed the patients History and Physical, chart, labs and discussed the procedure including the risks, benefits and alternatives for the proposed anesthesia with the patient or authorized representative who has indicated his/her understanding and acceptance.     Plan Discussed with: CRNA and Surgeon  Anesthesia Plan Comments:        Anesthesia Quick Evaluation

## 2013-12-04 NOTE — Interval H&P Note (Signed)
History and Physical Interval Note:  12/04/2013 12:07 PM  Allison [name]  has presented today for surgery, with the diagnosis of right ureteral stone  The various methods of treatment have been discussed with the patient and family. After consideration of risks, benefits and other options for treatment, the patient has consented to  Procedure(s): CYSTOSCOPY WITH RIGHT RETROGRADE PYELOGRAM (Right) RIGHT URETEROSCOPY WITH STONE EXTRACTION (Right) HOLMIUM LASER LITHOTRIPSY (Right) as a surgical intervention .  The patient's history has been reviewed, patient examined, no change in status, stable for surgery.  I have reviewed the patient's chart and labs.  Questions were answered to the patient's satisfaction.     [name]

## 2013-12-05 ENCOUNTER — Encounter (HOSPITAL_COMMUNITY): Payer: Self-pay | Admitting: Urology

## 2013-12-05 NOTE — Addendum Note (Signed)
Addendum  created 12/05/13 1340 by Charmaine Downs, CRNA   Modules edited: Notes Section   Notes Section:  File: [id number]

## 2013-12-05 NOTE — Anesthesia Postprocedure Evaluation (Signed)
  Anesthesia Post-op Note  Patient: Allison [name]  Procedure(s) Performed: Procedure(s): CYSTOSCOPY WITH RIGHT RETROGRADE PYELOGRAM, REMOVAL OF RIGHT DOUBLE J URETERAL STENT, RIGHT DOUBLE J STENT PLACMENT (Right) RIGHT URETERAL PYELOSCOPY - RIGID AND FLEXIBLE  WITH STONE EXTRACTION (Right) HOLMIUM LASER OF RIGHT RENAL STONE (Right)  Patient Location: PACU  Anesthesia Type:General  Level of Consciousness: awake, alert , oriented and patient cooperative  Airway and Oxygen Therapy: Patient Spontanous Breathing  Post-op Pain: none  Post-op Assessment: Post-op Vital signs reviewed, Patient's Cardiovascular Status Stable, Respiratory Function Stable, Patent Airway and Pain level controlled  Post-op Vital Signs: Reviewed and stable  Last Vitals:  Filed Vitals:   12/04/13 1437  BP: 181/89  Pulse: 74  Temp: 36.5 C  Resp:     Complications: No apparent anesthesia complications

## 2013-12-11 ENCOUNTER — Ambulatory Visit: Payer: Medicare Other | Admitting: Urology

## 2013-12-13 ENCOUNTER — Encounter: Payer: Medicare Other | Admitting: Family Medicine

## 2013-12-28 ENCOUNTER — Other Ambulatory Visit: Payer: Self-pay | Admitting: Family Medicine

## 2014-02-19 ENCOUNTER — Other Ambulatory Visit: Payer: Self-pay | Admitting: Family Medicine

## 2014-02-19 DIAGNOSIS — E785 Hyperlipidemia, unspecified: Secondary | ICD-10-CM | POA: Diagnosis not present

## 2014-02-19 DIAGNOSIS — E559 Vitamin D deficiency, unspecified: Secondary | ICD-10-CM | POA: Diagnosis not present

## 2014-02-19 DIAGNOSIS — R7301 Impaired fasting glucose: Secondary | ICD-10-CM | POA: Diagnosis not present

## 2014-02-19 DIAGNOSIS — I10 Essential (primary) hypertension: Secondary | ICD-10-CM | POA: Diagnosis not present

## 2014-02-19 DIAGNOSIS — E669 Obesity, unspecified: Secondary | ICD-10-CM | POA: Diagnosis not present

## 2014-02-19 LAB — LIPID PANEL
Cholesterol: 176 mg/dL (ref 0–200)
HDL: 53 mg/dL (ref >39)
LDL Cholesterol: 106 mg/dL — ABNORMAL HIGH (ref 0–99)
Total CHOL/HDL Ratio: 3.3 Ratio
Triglycerides: 83 mg/dL (ref <150)
VLDL: 17 mg/dL (ref 0–40)

## 2014-02-19 LAB — COMPREHENSIVE METABOLIC PANEL
ALT: 12 U/L (ref 0–35)
AST: 13 U/L (ref 0–37)
Albumin: 4 g/dL (ref 3.5–5.2)
Alkaline Phosphatase: 72 U/L (ref 39–117)
BILIRUBIN TOTAL: 1.3 mg/dL — AB (ref 0.2–1.2)
BUN: 12 mg/dL (ref 6–23)
CO2: 28 mEq/L (ref 19–32)
CREATININE: 0.82 mg/dL (ref 0.50–1.10)
Calcium: 9.3 mg/dL (ref 8.4–10.5)
Chloride: 104 mEq/L (ref 96–112)
GLUCOSE: 96 mg/dL (ref 70–99)
POTASSIUM: 4.2 meq/L (ref 3.5–5.3)
Sodium: 139 mEq/L (ref 135–145)
TOTAL PROTEIN: 7.1 g/dL (ref 6.0–8.3)

## 2014-02-19 LAB — HEMOGLOBIN A1C
HEMOGLOBIN A1C: 5.6 % (ref <5.7)
Mean Plasma Glucose: 114 mg/dL (ref <117)

## 2014-02-19 LAB — CBC
HCT: 39.6 % (ref 36.0–46.0)
Hemoglobin: 13.4 g/dL (ref 12.0–15.0)
MCH: 29.3 pg (ref 26.0–34.0)
MCHC: 33.8 g/dL (ref 30.0–36.0)
MCV: 86.5 fL (ref 78.0–100.0)
MPV: 10.3 fL (ref 8.6–12.4)
PLATELETS: 392 10*3/uL (ref 150–400)
RBC: 4.58 MIL/uL (ref 3.87–5.11)
RDW: 13.6 % (ref 11.5–15.5)
WBC: 6.7 10*3/uL (ref 4.0–10.5)

## 2014-02-19 LAB — TSH: TSH: 2.593 u[IU]/mL (ref 0.350–4.500)

## 2014-02-20 ENCOUNTER — Other Ambulatory Visit (HOSPITAL_COMMUNITY)
Admission: RE | Admit: 2014-02-20 | Discharge: 2014-02-20 | Disposition: A | Discharge status: Home / Self Care | Payer: Medicare Other | Source: Ambulatory Visit | Attending: Family Medicine | Admitting: Family Medicine

## 2014-02-20 ENCOUNTER — Ambulatory Visit (INDEPENDENT_AMBULATORY_CARE_PROVIDER_SITE_OTHER): Payer: Medicare Other | Admitting: Family Medicine

## 2014-02-20 ENCOUNTER — Encounter: Payer: Self-pay | Admitting: Family Medicine

## 2014-02-20 VITALS — BP 132/82 | HR 83 | Resp 16 | Ht 63.5 in | Wt 198.0 lb

## 2014-02-20 DIAGNOSIS — E785 Hyperlipidemia, unspecified: Secondary | ICD-10-CM

## 2014-02-20 DIAGNOSIS — I10 Essential (primary) hypertension: Secondary | ICD-10-CM

## 2014-02-20 DIAGNOSIS — Z1211 Encounter for screening for malignant neoplasm of colon: Secondary | ICD-10-CM

## 2014-02-20 DIAGNOSIS — H543 Unqualified visual loss, both eyes: Secondary | ICD-10-CM

## 2014-02-20 DIAGNOSIS — Z0000 Encounter for general adult medical examination without abnormal findings: Secondary | ICD-10-CM | POA: Diagnosis not present

## 2014-02-20 DIAGNOSIS — R7301 Impaired fasting glucose: Secondary | ICD-10-CM

## 2014-02-20 DIAGNOSIS — Z124 Encounter for screening for malignant neoplasm of cervix: Secondary | ICD-10-CM

## 2014-02-20 LAB — POC HEMOCCULT BLD/STL (OFFICE/1-CARD/DIAGNOSTIC): Fecal Occult Blood, POC: NEGATIVE

## 2014-02-20 LAB — VITAMIN D 25 HYDROXY (VIT D DEFICIENCY, FRACTURES): Vit D, 25-Hydroxy: 20 ng/mL — ABNORMAL LOW (ref 30–100)

## 2014-02-20 NOTE — Patient Instructions (Addendum)
Annual wellness in 6 month, call if yopu need me before  Blood work is much better, continue to eat healthily and commit to regualr exercise  All the best for 2016  You are referred to Dr Gershon Crane for eye exam  Fasting lipiod, cmp and eGFr and hBa1C in 6 month  You need aspirin 36m daily to reduce risk of stroke

## 2014-02-20 NOTE — Progress Notes (Signed)
   Subjective:    Patient ID: Allison [name], female    DOB: October 15, 1940, 74 y.o.   MRN: [id number]  HPI Patient is in for pelvic and breast exam. No other health concerns are expressed , recent labs are reviewed and are good, no medication changes needed  Vaccine offered and declined    Review of Systems See HPI     Objective:   Physical Exam BP 132/82 mmHg  Pulse 83  Resp 16  Ht 5' 3.5" (1.613 m)  Wt 198 lb (89.812 kg)  BMI 34.52 kg/m2  SpO2 97%  Pleasant well nourished female, alert and oriented x 3, in no cardio-pulmonary distress. Afebrile. HEENT No facial trauma or asymetry. Sinuses non tender.  Extra occullar muscles intact, pupils equally reactive to light. External ears normal, tympanic membranes clear. Oropharynx moist, no exudate, poor dentition. Neck: supple, no adenopathy,JVD or thyromegaly.No bruits.  Chest: Clear to ascultation bilaterally.No crackles or wheezes. Non tender to palpation  Breast: No asymetry,no masses or lumps. No tenderness. No nipple discharge or inversion. No axillary or supraclavicular adenopathy  Cardiovascular system; Heart sounds normal,  S1 and  S2 ,no S3.  No murmur, or thrill. Apical beat not displaced Peripheral pulses normal.  Abdomen: Soft, non tender, no organomegaly or masses. No bruits. Bowel sounds normal. No guarding, tenderness or rebound.  Rectal:  Normal sphincter tone. No mass.No rectal masses.  Guaiac negative stool.  GU: External genitalia normal female genitalia , female distribution of hair. No lesions. Urethral meatus normal in size, no  Prolapse, no lesions visibly  Present. Bladder non tender. Vagina pink and moist , with no visible lesions , discharge present . Adequate pelvic support no  cystocele or rectocele noted Cervix pink and appears healthy, no lesions or ulcerations noted, no discharge noted from os Uterus normal size, no adnexal masses, no cervical motion or adnexal  tenderness.   Musculoskeletal exam: Full ROM of spine, hips , shoulders and knees. No deformity ,swelling or crepitus noted. No muscle wasting or atrophy.   Neurologic: Cranial nerves 2 to 12 intact. Power, tone ,sensation and reflexes normal throughout. No disturbance in gait. No tremor.  Skin: Intact, no ulceration, erythema , scaling or rash noted. Pigmentation normal throughout  Psych; Normal mood and affect. Judgement and concentration normal        Assessment & Plan:  Annual physical exam Annual exam as documented. Counseling done  re healthy lifestyle involving commitment to 150 minutes exercise per week, heart healthy diet, and attaining healthy weight.The importance of adequate sleep also discussed. Regular seat belt use and home safety, is also discussed. Changes in health habits are decided on by the patient with goals and time frames  set for achieving them. Immunization and cancer screening needs are specifically addressed at this visit.

## 2014-02-20 NOTE — Assessment & Plan Note (Signed)

## 2014-02-22 LAB — CYTOLOGY - PAP

## 2014-02-25 MED ORDER — FLUCONAZOLE 150 MG PO TABS: 150.0000 mg | ORAL_TABLET | Freq: Once | ORAL | Status: DC

## 2014-02-25 NOTE — Addendum Note (Signed)
Addended by: Denman George B on: 02/25/2014 08:35 AM   Modules accepted: Orders

## 2014-02-26 ENCOUNTER — Other Ambulatory Visit: Payer: Self-pay

## 2014-02-26 MED ORDER — PRAVASTATIN SODIUM 20 MG PO TABS: ORAL_TABLET | ORAL | Status: DC

## 2014-02-26 MED ORDER — AMLODIPINE BESYLATE 2.5 MG PO TABS: 2.5000 mg | ORAL_TABLET | Freq: Every day | ORAL | Status: DC

## 2014-04-02 DIAGNOSIS — H5212 Myopia, left eye: Secondary | ICD-10-CM | POA: Diagnosis not present

## 2014-04-02 DIAGNOSIS — Z961 Presence of intraocular lens: Secondary | ICD-10-CM | POA: Diagnosis not present

## 2014-04-02 DIAGNOSIS — H25012 Cortical age-related cataract, left eye: Secondary | ICD-10-CM | POA: Diagnosis not present

## 2014-06-21 ENCOUNTER — Other Ambulatory Visit: Payer: Self-pay | Admitting: Family Medicine

## 2014-08-14 DIAGNOSIS — E785 Hyperlipidemia, unspecified: Secondary | ICD-10-CM | POA: Diagnosis not present

## 2014-08-14 DIAGNOSIS — R7301 Impaired fasting glucose: Secondary | ICD-10-CM | POA: Diagnosis not present

## 2014-08-14 DIAGNOSIS — I10 Essential (primary) hypertension: Secondary | ICD-10-CM | POA: Diagnosis not present

## 2014-08-15 LAB — COMPLETE METABOLIC PANEL WITH GFR
ALK PHOS: 66 U/L (ref 39–117)
ALT: 15 U/L (ref 0–35)
AST: 15 U/L (ref 0–37)
Albumin: 3.9 g/dL (ref 3.5–5.2)
BUN: 13 mg/dL (ref 6–23)
CHLORIDE: 107 meq/L (ref 96–112)
CO2: 27 mEq/L (ref 19–32)
Calcium: 9.6 mg/dL (ref 8.4–10.5)
Creat: 0.8 mg/dL (ref 0.50–1.10)
GFR, Est African American: 83 mL/min
GFR, Est Non African American: 72 mL/min
Glucose, Bld: 96 mg/dL (ref 70–99)
POTASSIUM: 4.2 meq/L (ref 3.5–5.3)
SODIUM: 142 meq/L (ref 135–145)
Total Bilirubin: 1.3 mg/dL — ABNORMAL HIGH (ref 0.2–1.2)
Total Protein: 7.6 g/dL (ref 6.0–8.3)

## 2014-08-15 LAB — HEMOGLOBIN A1C
HEMOGLOBIN A1C: 5.7 % — AB (ref <5.7)
MEAN PLASMA GLUCOSE: 117 mg/dL — AB (ref <117)

## 2014-08-15 LAB — LIPID PANEL
CHOL/HDL RATIO: 2.6 ratio
CHOLESTEROL: 165 mg/dL (ref 0–200)
HDL: 64 mg/dL (ref >=46)
LDL Cholesterol: 82 mg/dL (ref 0–99)
TRIGLYCERIDES: 93 mg/dL (ref <150)
VLDL: 19 mg/dL (ref 0–40)

## 2014-08-20 ENCOUNTER — Encounter: Payer: Self-pay | Admitting: Family Medicine

## 2014-08-20 ENCOUNTER — Ambulatory Visit (INDEPENDENT_AMBULATORY_CARE_PROVIDER_SITE_OTHER): Payer: Medicare Other | Admitting: Family Medicine

## 2014-08-20 VITALS — BP 126/68 | HR 74 | Resp 18 | Ht 62.25 in | Wt 202.1 lb

## 2014-08-20 DIAGNOSIS — Z0000 Encounter for general adult medical examination without abnormal findings: Secondary | ICD-10-CM | POA: Diagnosis not present

## 2014-08-20 DIAGNOSIS — I10 Essential (primary) hypertension: Secondary | ICD-10-CM

## 2014-08-20 DIAGNOSIS — Z23 Encounter for immunization: Secondary | ICD-10-CM | POA: Insufficient documentation

## 2014-08-20 DIAGNOSIS — E785 Hyperlipidemia, unspecified: Secondary | ICD-10-CM

## 2014-08-20 NOTE — Assessment & Plan Note (Signed)
After obtaining informed consent, the vaccine is  administered by LPN.  

## 2014-08-20 NOTE — Progress Notes (Signed)
Subjective:    Patient ID: Lytle Butte, female    DOB: 1940-11-22, 74 y.o.   MRN: [id number]  HPI Preventive Screening-Counseling & Management   Patient present here today for a Medicare annual wellness visit.   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 No  Over the past two weeks, have you felt little interest or pleasure in doing things? No  Have you lost interest or pleasure in daily life? No  Do you often feel hopeless? No  Do you cry easily over simple problems? No   Activities of Daily Living  In your present state of health, do you have any difficulty performing the following activities?  Driving?: No Managing money?: No Feeding yourself?:No Getting from bed to chair?:No Climbing a flight of stairs?:No Preparing food and eating?:No Bathing or showering?:No Getting dressed?:No Getting to the toilet?:No Using the toilet?:No Moving around from place to place?: No  Fall Risk Assessment In the past year have you fallen or had a near fall?:No Are you currently taking any medications that make you dizzy?:No   Hearing Difficulties: No Do you often ask people to speak up or repeat themselves?:No Do you experience ringing or noises in your ears?:No Do you have difficulty understanding soft or whispered voices?:No  Cognitive Testing  Alert? Yes Normal Appearance?Yes  Oriented to person? Yes Place? Yes  Time? Yes  Displays appropriate judgment?Yes  Can  read the correct time from a watch face? yes Are you having problems remembering things?No  Advanced Directives have been discussed with the patient?Yes and brochure , full code   List the Names of Other Physician/Practitioners you currently use: careteams updated    Indicate any recent Medical Services you may have received from other than [facility] providers in the past year (date may be approximate).   Assessment:    Annual Wellness Exam   Plan:     Medicare Attestation  I have personally reviewed:  The patient's medical and social history  Their use of alcohol, tobacco or illicit drugs  Their current medications and supplements  The patient's functional ability including ADLs,fall risks, home safety risks, cognitive, and hearing and visual impairment  Diet and physical activities  Evidence for depression or mood disorders  The patient's weight, height, BMI, and visual acuity have been recorded in the chart. I have made referrals, counseling, and provided education to the patient based on review of the above and I have provided the patient with a written personalized care plan for preventive services.      Review of Systems     Objective:   Physical Exam  BP 126/68 mmHg  Pulse 74  Resp 18  Ht 5' 2.25" (1.581 m)  Wt 202 lb 1.9 oz (91.681 kg)  BMI 36.68 kg/m2  SpO2 97%       Assessment & Plan:  Medicare annual wellness visit, subsequent Annual exam as documented. Counseling done  re healthy lifestyle involving commitment to 150 minutes exercise  per week, heart healthy diet, and attaining healthy weight.The importance of adequate sleep also discussed. Regular seat belt use and home safety, is also discussed. Changes in health habits are decided on by the patient with goals and time frames  set for achieving them. Immunization and cancer screening needs are specifically addressed at this visit.   Need for vaccination with 13-polyvalent pneumococcal conjugate  vaccine After obtaining informed consent, the vaccine is  administered by LPN.

## 2014-08-20 NOTE — Assessment & Plan Note (Signed)

## 2014-08-20 NOTE — Patient Instructions (Signed)
F/u in early January,, call if you need me before   Please  Commit  To daily walking for 30 minutes, and changing eating  Five pound weight loss is the goal  Prevnar today

## 2014-08-25 ENCOUNTER — Encounter: Payer: Self-pay | Admitting: Family Medicine

## 2014-10-03 ENCOUNTER — Other Ambulatory Visit: Payer: Self-pay | Admitting: Family Medicine

## 2014-12-05 ENCOUNTER — Telehealth: Payer: Self-pay | Admitting: Family Medicine

## 2014-12-05 NOTE — Telephone Encounter (Signed)
Opened in Error.

## 2014-12-11 ENCOUNTER — Other Ambulatory Visit: Payer: Self-pay | Admitting: Family Medicine

## 2014-12-11 DIAGNOSIS — Z1231 Encounter for screening mammogram for malignant neoplasm of breast: Secondary | ICD-10-CM

## 2014-12-23 ENCOUNTER — Ambulatory Visit (HOSPITAL_COMMUNITY)
Admission: RE | Admit: 2014-12-23 | Discharge: 2014-12-23 | Disposition: A | Discharge status: Home / Self Care | Payer: Medicare Other | Source: Ambulatory Visit | Attending: Family Medicine | Admitting: Family Medicine

## 2014-12-23 DIAGNOSIS — R928 Other abnormal and inconclusive findings on diagnostic imaging of breast: Secondary | ICD-10-CM | POA: Diagnosis not present

## 2014-12-23 DIAGNOSIS — Z1231 Encounter for screening mammogram for malignant neoplasm of breast: Secondary | ICD-10-CM

## 2014-12-25 ENCOUNTER — Other Ambulatory Visit: Payer: Self-pay | Admitting: Family Medicine

## 2014-12-25 DIAGNOSIS — R928 Other abnormal and inconclusive findings on diagnostic imaging of breast: Secondary | ICD-10-CM

## 2015-01-10 ENCOUNTER — Other Ambulatory Visit: Payer: Self-pay | Admitting: Family Medicine

## 2015-02-03 ENCOUNTER — Telehealth: Payer: Self-pay

## 2015-02-03 DIAGNOSIS — R928 Other abnormal and inconclusive findings on diagnostic imaging of breast: Secondary | ICD-10-CM

## 2015-02-03 NOTE — Telephone Encounter (Signed)
Orders entered for US breast right to be done if additional imaging is needed per new protocol

## 2015-02-04 ENCOUNTER — Encounter (HOSPITAL_COMMUNITY): Payer: Self-pay

## 2015-02-06 ENCOUNTER — Other Ambulatory Visit: Payer: Self-pay

## 2015-02-06 DIAGNOSIS — R928 Other abnormal and inconclusive findings on diagnostic imaging of breast: Secondary | ICD-10-CM

## 2015-02-17 ENCOUNTER — Telehealth: Payer: Self-pay

## 2015-02-17 DIAGNOSIS — E785 Hyperlipidemia, unspecified: Secondary | ICD-10-CM | POA: Diagnosis not present

## 2015-02-17 DIAGNOSIS — E559 Vitamin D deficiency, unspecified: Secondary | ICD-10-CM

## 2015-02-17 DIAGNOSIS — I10 Essential (primary) hypertension: Secondary | ICD-10-CM

## 2015-02-17 DIAGNOSIS — R7301 Impaired fasting glucose: Secondary | ICD-10-CM

## 2015-02-17 LAB — CBC WITH DIFFERENTIAL/PLATELET
BASOS ABS: 0.1 10*3/uL (ref 0.0–0.1)
BASOS PCT: 1 % (ref 0–1)
EOS ABS: 0.4 10*3/uL (ref 0.0–0.7)
Eosinophils Relative: 5 % (ref 0–5)
HCT: 39.6 % (ref 36.0–46.0)
HEMOGLOBIN: 13 g/dL (ref 12.0–15.0)
Lymphocytes Relative: 51 % — ABNORMAL HIGH (ref 12–46)
Lymphs Abs: 3.7 10*3/uL (ref 0.7–4.0)
MCH: 28.6 pg (ref 26.0–34.0)
MCHC: 32.8 g/dL (ref 30.0–36.0)
MCV: 87.2 fL (ref 78.0–100.0)
MPV: 10.7 fL (ref 8.6–12.4)
Monocytes Absolute: 0.4 10*3/uL (ref 0.1–1.0)
Monocytes Relative: 6 % (ref 3–12)
NEUTROS ABS: 2.7 10*3/uL (ref 1.7–7.7)
Neutrophils Relative %: 37 % — ABNORMAL LOW (ref 43–77)
PLATELETS: 396 10*3/uL (ref 150–400)
RBC: 4.54 MIL/uL (ref 3.87–5.11)
RDW: 13.7 % (ref 11.5–15.5)
WBC: 7.3 10*3/uL (ref 4.0–10.5)

## 2015-02-17 LAB — COMPLETE METABOLIC PANEL WITH GFR
ALT: 12 U/L (ref 6–29)
AST: 10 U/L (ref 10–35)
Albumin: 4 g/dL (ref 3.6–5.1)
Alkaline Phosphatase: 72 U/L (ref 33–130)
BUN: 16 mg/dL (ref 7–25)
CHLORIDE: 104 mmol/L (ref 98–110)
CO2: 25 mmol/L (ref 20–31)
CREATININE: 0.82 mg/dL (ref 0.60–0.93)
Calcium: 9.2 mg/dL (ref 8.6–10.4)
GFR, Est African American: 80 mL/min (ref >=60)
GFR, Est Non African American: 70 mL/min (ref >=60)
Glucose, Bld: 103 mg/dL — ABNORMAL HIGH (ref 65–99)
Potassium: 4.3 mmol/L (ref 3.5–5.3)
SODIUM: 141 mmol/L (ref 135–146)
Total Bilirubin: 1 mg/dL (ref 0.2–1.2)
Total Protein: 7 g/dL (ref 6.1–8.1)

## 2015-02-17 LAB — LIPID PANEL
CHOLESTEROL: 169 mg/dL (ref 125–200)
HDL: 56 mg/dL (ref >=46)
LDL CALC: 99 mg/dL (ref <130)
Total CHOL/HDL Ratio: 3 Ratio (ref <=5.0)
Triglycerides: 72 mg/dL (ref <150)
VLDL: 14 mg/dL (ref <30)

## 2015-02-17 LAB — TSH: TSH: 2.213 u[IU]/mL (ref 0.350–4.500)

## 2015-02-17 LAB — HEMOGLOBIN A1C
Hgb A1c MFr Bld: 5.7 % — ABNORMAL HIGH (ref <5.7)
Mean Plasma Glucose: 117 mg/dL — ABNORMAL HIGH (ref <117)

## 2015-02-17 NOTE — Telephone Encounter (Signed)
Labs ordered.

## 2015-02-18 ENCOUNTER — Ambulatory Visit (HOSPITAL_COMMUNITY)
Admission: RE | Admit: 2015-02-18 | Discharge: 2015-02-18 | Disposition: A | Discharge status: Home / Self Care | Payer: Medicare Other | Source: Ambulatory Visit | Attending: Family Medicine | Admitting: Family Medicine

## 2015-02-18 ENCOUNTER — Other Ambulatory Visit: Payer: Self-pay | Admitting: Family Medicine

## 2015-02-18 DIAGNOSIS — R928 Other abnormal and inconclusive findings on diagnostic imaging of breast: Secondary | ICD-10-CM | POA: Insufficient documentation

## 2015-02-18 DIAGNOSIS — N63 Unspecified lump in breast: Secondary | ICD-10-CM | POA: Diagnosis not present

## 2015-02-18 DIAGNOSIS — N6310 Unspecified lump in the right breast, unspecified quadrant: Secondary | ICD-10-CM

## 2015-02-18 LAB — VITAMIN D 25 HYDROXY (VIT D DEFICIENCY, FRACTURES): Vit D, 25-Hydroxy: 23 ng/mL — ABNORMAL LOW (ref 30–100)

## 2015-02-20 ENCOUNTER — Ambulatory Visit (INDEPENDENT_AMBULATORY_CARE_PROVIDER_SITE_OTHER): Payer: Medicare Other | Admitting: Family Medicine

## 2015-02-20 ENCOUNTER — Ambulatory Visit: Payer: Medicare Other | Admitting: Family Medicine

## 2015-02-20 ENCOUNTER — Encounter: Payer: Self-pay | Admitting: Family Medicine

## 2015-02-20 VITALS — BP 130/74 | HR 76 | Resp 18 | Ht 62.5 in | Wt 199.1 lb

## 2015-02-20 DIAGNOSIS — Z23 Encounter for immunization: Secondary | ICD-10-CM

## 2015-02-20 DIAGNOSIS — E669 Obesity, unspecified: Secondary | ICD-10-CM

## 2015-02-20 DIAGNOSIS — I10 Essential (primary) hypertension: Secondary | ICD-10-CM | POA: Diagnosis not present

## 2015-02-20 DIAGNOSIS — R7301 Impaired fasting glucose: Secondary | ICD-10-CM | POA: Diagnosis not present

## 2015-02-20 DIAGNOSIS — E785 Hyperlipidemia, unspecified: Secondary | ICD-10-CM

## 2015-02-20 DIAGNOSIS — K5900 Constipation, unspecified: Secondary | ICD-10-CM

## 2015-02-20 NOTE — Progress Notes (Signed)
Subjective:    Patient ID: Allison [name], female    DOB: [birth year], 75 y.o.   MRN: [id number]  HPI   Allison [name]     MRN: [id number]      DOB: [birth year]   HPI Allison [name] is here for follow up and re-evaluation of chronic medical conditions, medication management and review of any available recent lab and radiology data.  Preventive health is updated, specifically  Cancer screening and Immunization.   Questions or concerns regarding consultations or procedures which the PT has had in the interim are  addressed. The PT denies any adverse reactions to current medications since the last visit.  There are no new concerns.  There are no specific complaints   ROS Denies recent fever or chills. Denies sinus pressure, nasal congestion, ear pain or sore throat. Denies chest congestion, productive cough or wheezing. Denies chest pains, palpitations and leg swelling Denies abdominal pain, nausea, vomiting,diarrhea , does c/o intermittent bouts of  Constipation.States she is aware that she is less active and not committed  to a vegetable rich diet , will change back and note effect    Denies dysuria, frequency, hesitancy or incontinence. Denies joint pain, swelling and limitation in mobility. Denies headaches, seizures, numbness, or tingling. Denies depression, anxiety or insomnia. Denies skin break down or rash.   PE  BP 130/74 mmHg  Pulse 76  Resp 18  Ht 5' 2.5" (1.588 m)  Wt 199 lb 1.9 oz (90.32 kg)  BMI 35.82 kg/m2  SpO2 96%  Patient alert and oriented and in no cardiopulmonary distress.  HEENT: No facial asymmetry, EOMI,   oropharynx pink and moist.  Neck supple no JVD, no mass.  Chest: Clear to auscultation bilaterally.  CVS: S1, S2 no murmurs, no S3.Regular rate.  ABD: Soft non tender.   Ext: No edema  MS: Adequate ROM spine, shoulders, hips and knees.  Skin: Intact, no ulcerations or rash noted.  Psych: Good eye contact, normal affect. Memory intact not  anxious or depressed appearing.  CNS: CN 2-12 intact, power,  normal throughout.no focal deficits noted.   Assessment & Plan   HTN (hypertension) Controlled, no change in medication DASH diet and commitment to daily physical activity for a minimum of 30 minutes discussed and encouraged, as a part of hypertension management. The importance of attaining a healthy weight is also discussed.  BP/Weight 02/20/2015 08/20/2014 02/20/2014 12/04/2013 11/27/2013 11/05/2013 [id number]  Systolic BP [id number] Q000111Q 0000000 Q000111Q [id number] Q000111Q  Diastolic BP 74 68 82 89 68 80 71  Wt. (Lbs) 199.12 202.12 198 - 196 196.12 -  BMI 35.82 36.68 34.52 - 34.73 34.19 -        Hyperlipidemia LDL goal <100 Hyperlipidemia:Low fat diet discussed and encouraged.   Lipid Panel  Lab Results  Component Value Date   CHOL 169 02/17/2015   HDL 56 02/17/2015   LDLCALC 99 02/17/2015   TRIG 72 02/17/2015   CHOLHDL 3.0 02/17/2015   Controlled, no change in medication      OBESITY Improved. Pt applauded on succesful weight loss through lifestyle change, and encouraged to continue same. Weight loss goal set for the next several months.   Impaired fasting glucose Deteriorated Patient educated about the importance of limiting  Carbohydrate intake , the need to commit to daily physical activity for a minimum of 30 minutes , and to commit weight loss. The fact that changes in all these areas will reduce or eliminate all together the development of  diabetes is stressed.   Diabetic Labs Latest Ref Rng 02/17/2015 08/14/2014 02/19/2014 11/27/2013 10/23/2013  HbA1c <5.7 % 5.7(H) 5.7(H) 5.6 - -  Chol 125 - 200 mg/dL 169 165 176 - -  HDL >=46 mg/dL 56 64 53 - -  Calc LDL <130 mg/dL 99 82 106(H) - -  Triglycerides <150 mg/dL 72 93 83 - -  Creatinine 0.60 - 0.93 mg/dL 0.82 0.80 0.82 0.91 0.95   BP/Weight 02/20/2015 08/20/2014 02/20/2014 12/04/2013 11/27/2013 11/05/2013 [id number]  Systolic BP [id number] Q000111Q 0000000 Q000111Q [id number] Q000111Q  Diastolic BP 74  68 82 89 68 80 71  Wt. (Lbs) 199.12 202.12 198 - 196 196.12 -  BMI 35.82 36.68 34.52 - 34.73 34.19 -   No flowsheet data found.     Need for prophylactic vaccination and inoculation against influenza After obtaining informed consent, the vaccine is  administered by LPN.   Constipation Will work on diet and regular physical activity to improve bowel movements. She notes that the problem is related to both. Denies change in stool caliber, black stool or blood in stool      Review of Systems     Objective:   Physical Exam        Assessment & Plan:

## 2015-02-20 NOTE — Patient Instructions (Signed)
Physical exam in 5.5 month, call iof you need me sooner  Flu vaccine today  All the best with upcoming biopsy  Increase vegetable and exercise to improve bowels  Thanks for choosing [facility], we consider it a privelige to serve you.  Fasting lipid, cmp and EGFr in 5.5  month

## 2015-02-22 DIAGNOSIS — Z23 Encounter for immunization: Secondary | ICD-10-CM | POA: Insufficient documentation

## 2015-02-22 DIAGNOSIS — K5900 Constipation, unspecified: Secondary | ICD-10-CM | POA: Insufficient documentation

## 2015-02-22 NOTE — Assessment & Plan Note (Signed)
Deteriorated Patient educated about the importance of limiting  Carbohydrate intake , the need to commit to daily physical activity for a minimum of 30 minutes , and to commit weight loss. The fact that changes in all these areas will reduce or eliminate all together the development of diabetes is stressed.   Diabetic Labs Latest Ref Rng 02/17/2015 08/14/2014 02/19/2014 11/27/2013 10/23/2013  HbA1c <5.7 % 5.7(H) 5.7(H) 5.6 - -  Chol 125 - 200 mg/dL 169 165 176 - -  HDL >=46 mg/dL 56 64 53 - -  Calc LDL <130 mg/dL 99 82 106(H) - -  Triglycerides <150 mg/dL 72 93 83 - -  Creatinine 0.60 - 0.93 mg/dL 0.82 0.80 0.82 0.91 0.95   BP/Weight 02/20/2015 08/20/2014 02/20/2014 12/04/2013 11/27/2013 11/05/2013 [id number]  Systolic BP [id number] Q000111Q 0000000 Q000111Q [id number] Q000111Q  Diastolic BP 74 68 82 89 68 80 71  Wt. (Lbs) 199.12 202.12 198 - 196 196.12 -  BMI 35.82 36.68 34.52 - 34.73 34.19 -   No flowsheet data found.

## 2015-02-22 NOTE — Assessment & Plan Note (Signed)
Hyperlipidemia:Low fat diet discussed and encouraged.   Lipid Panel  Lab Results  Component Value Date   CHOL 169 02/17/2015   HDL 56 02/17/2015   LDLCALC 99 02/17/2015   TRIG 72 02/17/2015   CHOLHDL 3.0 02/17/2015   Controlled, no change in medication

## 2015-02-22 NOTE — Assessment & Plan Note (Signed)
Controlled, no change in medication DASH diet and commitment to daily physical activity for a minimum of 30 minutes discussed and encouraged, as a part of hypertension management. The importance of attaining a healthy weight is also discussed.  BP/Weight 02/20/2015 08/20/2014 02/20/2014 12/04/2013 11/27/2013 11/05/2013 [id number]  Systolic BP [id number] Q000111Q 0000000 Q000111Q [id number] Q000111Q  Diastolic BP 74 68 82 89 68 80 71  Wt. (Lbs) 199.12 202.12 198 - 196 196.12 -  BMI 35.82 36.68 34.52 - 34.73 34.19 -

## 2015-02-22 NOTE — Assessment & Plan Note (Signed)
After obtaining informed consent, the vaccine is  administered by LPN.  

## 2015-02-22 NOTE — Assessment & Plan Note (Signed)
Improved. Pt applauded on succesful weight loss through lifestyle change, and encouraged to continue same. Weight loss goal set for the next several months.  

## 2015-02-22 NOTE — Assessment & Plan Note (Signed)
Will work on diet and regular physical activity to improve bowel movements. She notes that the problem is related to both. Denies change in stool caliber, black stool or blood in stool

## 2015-03-04 ENCOUNTER — Ambulatory Visit (HOSPITAL_COMMUNITY)
Admission: RE | Admit: 2015-03-04 | Discharge: 2015-03-04 | Disposition: A | Discharge status: Home / Self Care | Payer: Medicare Other | Source: Ambulatory Visit | Attending: Family Medicine | Admitting: Family Medicine

## 2015-03-04 ENCOUNTER — Other Ambulatory Visit: Payer: Self-pay | Admitting: Family Medicine

## 2015-03-04 DIAGNOSIS — N63 Unspecified lump in breast: Secondary | ICD-10-CM | POA: Insufficient documentation

## 2015-03-04 DIAGNOSIS — N6001 Solitary cyst of right breast: Secondary | ICD-10-CM | POA: Diagnosis not present

## 2015-03-04 DIAGNOSIS — N6310 Unspecified lump in the right breast, unspecified quadrant: Secondary | ICD-10-CM

## 2015-03-04 MED ORDER — LIDOCAINE HCL (PF) 1 % IJ SOLN
INTRAMUSCULAR | Status: AC
  Filled 2015-03-04: qty 10

## 2015-04-29 ENCOUNTER — Telehealth: Payer: Self-pay | Admitting: Family Medicine

## 2015-04-29 DIAGNOSIS — H2512 Age-related nuclear cataract, left eye: Secondary | ICD-10-CM | POA: Diagnosis not present

## 2015-04-29 DIAGNOSIS — Z961 Presence of intraocular lens: Secondary | ICD-10-CM | POA: Diagnosis not present

## 2015-04-29 NOTE — Telephone Encounter (Signed)
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

## 2015-04-29 NOTE — Telephone Encounter (Signed)
Ok to excuse patient from duty?

## 2015-04-29 NOTE — Telephone Encounter (Signed)
Allison [name] has received a letter for jury duty and she is asking if Dr. Moshe Cipro could write her a letter stating that she is unable to do jury duty at this time, please advise?

## 2015-04-30 NOTE — Telephone Encounter (Signed)
Letter typed

## 2015-05-15 NOTE — Patient Instructions (Signed)
Your procedure is scheduled on: 05/20/2015  Report to [facility] at  900  AM.  Call this number if you have problems the morning of surgery: [phone]   Do not eat food or drink liquids :After Midnight.      Take these medicines the morning of surgery with A SIP OF WATER: norvasc.   Do not wear jewelry, make-up or nail polish.  Do not wear lotions, powders, or perfumes. You may wear deodorant.  Do not shave 48 hours prior to surgery.  Do not bring valuables to the hospital.  Contacts, dentures or bridgework may not be worn into surgery.  Leave suitcase in the car. After surgery it may be brought to your room.  For patients admitted to the hospital, checkout time is 11:00 AM the day of discharge.   Patients discharged the day of surgery will not be allowed to drive home.  :     Please read over the following fact sheets that you were given: Coughing and Deep Breathing, Surgical Site Infection Prevention, Anesthesia Post-op Instructions and Care and Recovery After Surgery    Cataract A cataract is a clouding of the lens of the eye. When a lens becomes cloudy, vision is reduced based on the degree and nature of the clouding. Many cataracts reduce vision to some degree. Some cataracts make people more near-sighted as they develop. Other cataracts increase glare. Cataracts that are ignored and become worse can sometimes look white. The white color can be seen through the pupil. CAUSES   Aging. However, cataracts may occur at any age, even in newborns.   Certain drugs.   Trauma to the eye.   Certain diseases such as diabetes.   Specific eye diseases such as chronic inflammation inside the eye or a sudden attack of a rare form of glaucoma.   Inherited or acquired medical problems.  SYMPTOMS   Gradual, progressive drop in vision in the affected eye.   Severe, rapid visual loss. This most often happens when trauma is the cause.  DIAGNOSIS  To detect a cataract, an eye doctor  examines the lens. Cataracts are best diagnosed with an exam of the eyes with the pupils enlarged (dilated) by drops.  TREATMENT  For an early cataract, vision may improve by using different eyeglasses or stronger lighting. If that does not help your vision, surgery is the only effective treatment. A cataract needs to be surgically removed when vision loss interferes with your everyday activities, such as driving, reading, or watching TV. A cataract may also have to be removed if it prevents examination or treatment of another eye problem. Surgery removes the cloudy lens and usually replaces it with a substitute lens (intraocular lens, IOL).  At a time when both you and your doctor agree, the cataract will be surgically removed. If you have cataracts in both eyes, only one is usually removed at a time. This allows the operated eye to heal and be out of danger from any possible problems after surgery (such as infection or poor wound healing). In rare cases, a cataract may be doing damage to your eye. In these cases, your caregiver may advise surgical removal right away. The vast majority of people who have cataract surgery have better vision afterward. HOME CARE INSTRUCTIONS  If you are not planning surgery, you may be asked to do the following:  Use different eyeglasses.   Use stronger or brighter lighting.   Ask your eye doctor about reducing your medicine dose or  changing medicines if it is thought that a medicine caused your cataract. Changing medicines does not make the cataract go away on its own.   Become familiar with your surroundings. Poor vision can lead to injury. Avoid bumping into things on the affected side. You are at a higher risk for tripping or falling.   Exercise extreme care when driving or operating machinery.   Wear sunglasses if you are sensitive to bright light or experiencing problems with glare.  SEEK IMMEDIATE MEDICAL CARE IF:   You have a worsening or sudden vision  loss.   You notice redness, swelling, or increasing pain in the eye.   You have a fever.  Document Released: 01/11/2005 Document Revised: 12/31/2010 Document Reviewed: 09/04/2010 [facility] Patient Information 2012 [place].PATIENT INSTRUCTIONS POST-ANESTHESIA  IMMEDIATELY FOLLOWING SURGERY:  Do not drive or operate machinery for the first twenty four hours after surgery.  Do not make any important decisions for twenty four hours after surgery or while taking narcotic pain medications or sedatives.  If you develop intractable nausea and vomiting or a severe headache please notify your doctor immediately.  FOLLOW-UP:  Please make an appointment with your surgeon as instructed. You do not need to follow up with anesthesia unless specifically instructed to do so.  WOUND CARE INSTRUCTIONS (if applicable):  Keep a dry clean dressing on the anesthesia/puncture wound site if there is drainage.  Once the wound has quit draining you may leave it open to air.  Generally you should leave the bandage intact for twenty four hours unless there is drainage.  If the epidural site drains for more than 36-48 hours please call the anesthesia department.  QUESTIONS?:  Please feel free to call your physician or the hospital operator if you have any questions, and they will be happy to assist you.

## 2015-05-16 ENCOUNTER — Encounter (HOSPITAL_COMMUNITY): Payer: Self-pay

## 2015-05-16 ENCOUNTER — Encounter (HOSPITAL_COMMUNITY)
Admission: RE | Admit: 2015-05-16 | Discharge: 2015-05-16 | Disposition: A | Discharge status: Home / Self Care | Payer: Medicare Other | Source: Ambulatory Visit | Attending: Ophthalmology | Admitting: Ophthalmology

## 2015-05-16 ENCOUNTER — Other Ambulatory Visit: Payer: Self-pay

## 2015-05-16 DIAGNOSIS — Z01812 Encounter for preprocedural laboratory examination: Secondary | ICD-10-CM | POA: Diagnosis not present

## 2015-05-16 DIAGNOSIS — Z01810 Encounter for preprocedural cardiovascular examination: Secondary | ICD-10-CM | POA: Insufficient documentation

## 2015-05-16 LAB — BASIC METABOLIC PANEL
ANION GAP: 8 (ref 5–15)
BUN: 16 mg/dL (ref 6–20)
CALCIUM: 9.1 mg/dL (ref 8.9–10.3)
CHLORIDE: 109 mmol/L (ref 101–111)
CO2: 24 mmol/L (ref 22–32)
Creatinine, Ser: 0.84 mg/dL (ref 0.44–1.00)
GFR calc non Af Amer: >60 mL/min (ref >60)
Glucose, Bld: 99 mg/dL (ref 65–99)
POTASSIUM: 3.5 mmol/L (ref 3.5–5.1)
Sodium: 141 mmol/L (ref 135–145)

## 2015-05-16 LAB — CBC WITH DIFFERENTIAL/PLATELET
BASOS ABS: 0 10*3/uL (ref 0.0–0.1)
BASOS PCT: 0 %
Eosinophils Absolute: 0.3 10*3/uL (ref 0.0–0.7)
Eosinophils Relative: 4 %
HEMATOCRIT: 38.9 % (ref 36.0–46.0)
HEMOGLOBIN: 12.8 g/dL (ref 12.0–15.0)
LYMPHS PCT: 58 %
Lymphs Abs: 4.2 10*3/uL — ABNORMAL HIGH (ref 0.7–4.0)
MCH: 28.7 pg (ref 26.0–34.0)
MCHC: 32.9 g/dL (ref 30.0–36.0)
MCV: 87.2 fL (ref 78.0–100.0)
Monocytes Absolute: 0.4 10*3/uL (ref 0.1–1.0)
Monocytes Relative: 6 %
NEUTROS ABS: 2.3 10*3/uL (ref 1.7–7.7)
NEUTROS PCT: 32 %
Platelets: 381 10*3/uL (ref 150–400)
RBC: 4.46 MIL/uL (ref 3.87–5.11)
RDW: 13.3 % (ref 11.5–15.5)
WBC: 7.1 10*3/uL (ref 4.0–10.5)

## 2015-05-20 ENCOUNTER — Ambulatory Visit (HOSPITAL_COMMUNITY)
Admission: RE | Admit: 2015-05-20 | Discharge: 2015-05-20 | Disposition: A | Discharge status: Home / Self Care | Payer: Medicare Other | Source: Ambulatory Visit | Attending: Ophthalmology | Admitting: Ophthalmology

## 2015-05-20 ENCOUNTER — Encounter (HOSPITAL_COMMUNITY): Payer: Self-pay | Admitting: *Deleted

## 2015-05-20 ENCOUNTER — Ambulatory Visit (HOSPITAL_COMMUNITY): Payer: Medicare Other | Admitting: Anesthesiology

## 2015-05-20 ENCOUNTER — Encounter (HOSPITAL_COMMUNITY)
Admission: RE | Disposition: A | Discharge status: Home / Self Care | Payer: Self-pay | Source: Ambulatory Visit | Attending: Ophthalmology

## 2015-05-20 DIAGNOSIS — Z6835 Body mass index (BMI) 35.0-35.9, adult: Secondary | ICD-10-CM | POA: Insufficient documentation

## 2015-05-20 DIAGNOSIS — I10 Essential (primary) hypertension: Secondary | ICD-10-CM | POA: Diagnosis not present

## 2015-05-20 DIAGNOSIS — Z7982 Long term (current) use of aspirin: Secondary | ICD-10-CM | POA: Insufficient documentation

## 2015-05-20 DIAGNOSIS — H269 Unspecified cataract: Secondary | ICD-10-CM | POA: Diagnosis not present

## 2015-05-20 DIAGNOSIS — E7800 Pure hypercholesterolemia, unspecified: Secondary | ICD-10-CM | POA: Insufficient documentation

## 2015-05-20 DIAGNOSIS — Z79899 Other long term (current) drug therapy: Secondary | ICD-10-CM | POA: Insufficient documentation

## 2015-05-20 DIAGNOSIS — H2512 Age-related nuclear cataract, left eye: Secondary | ICD-10-CM | POA: Diagnosis not present

## 2015-05-20 DIAGNOSIS — K219 Gastro-esophageal reflux disease without esophagitis: Secondary | ICD-10-CM | POA: Insufficient documentation

## 2015-05-20 DIAGNOSIS — H268 Other specified cataract: Secondary | ICD-10-CM | POA: Insufficient documentation

## 2015-05-20 HISTORY — PX: CATARACT EXTRACTION W/PHACO: SHX586

## 2015-05-20 SURGERY — PHACOEMULSIFICATION, CATARACT, WITH IOL INSERTION
Anesthesia: Monitor Anesthesia Care | Site: Eye | Laterality: Left

## 2015-05-20 MED ORDER — EPINEPHRINE HCL 1 MG/ML IJ SOLN
INTRAOCULAR | Status: DC | PRN
  Administered 2015-05-20: 500 mL

## 2015-05-20 MED ORDER — LACTATED RINGERS IV SOLN
INTRAVENOUS | Status: DC
  Administered 2015-05-20: 10:00:00 via INTRAVENOUS

## 2015-05-20 MED ORDER — MIDAZOLAM HCL 2 MG/2ML IJ SOLN
INTRAMUSCULAR | Status: AC
  Filled 2015-05-20: qty 2

## 2015-05-20 MED ORDER — MIDAZOLAM HCL 2 MG/2ML IJ SOLN
1.0000 mg | INTRAMUSCULAR | Status: DC | PRN
  Administered 2015-05-20: 2 mg via INTRAVENOUS

## 2015-05-20 MED ORDER — PROVISC 10 MG/ML IO SOLN
INTRAOCULAR | Status: DC | PRN
  Administered 2015-05-20: 0.85 mL via INTRAOCULAR

## 2015-05-20 MED ORDER — CYCLOPENTOLATE-PHENYLEPHRINE 0.2-1 % OP SOLN
1.0000 [drp] | OPHTHALMIC | Status: AC
  Administered 2015-05-20 (×3): 1 [drp] via OPHTHALMIC

## 2015-05-20 MED ORDER — EPINEPHRINE HCL 1 MG/ML IJ SOLN
INTRAMUSCULAR | Status: AC
  Filled 2015-05-20: qty 1

## 2015-05-20 MED ORDER — PHENYLEPHRINE HCL 2.5 % OP SOLN
1.0000 [drp] | OPHTHALMIC | Status: AC
  Administered 2015-05-20 (×3): 1 [drp] via OPHTHALMIC

## 2015-05-20 MED ORDER — BSS IO SOLN
INTRAOCULAR | Status: DC | PRN
  Administered 2015-05-20: 15 mL

## 2015-05-20 MED ORDER — KETOROLAC TROMETHAMINE 0.5 % OP SOLN
1.0000 [drp] | OPHTHALMIC | Status: AC
  Administered 2015-05-20 (×3): 1 [drp] via OPHTHALMIC

## 2015-05-20 MED ORDER — TETRACAINE HCL 0.5 % OP SOLN
1.0000 [drp] | OPHTHALMIC | Status: AC
  Administered 2015-05-20 (×3): 1 [drp] via OPHTHALMIC

## 2015-05-20 MED ORDER — MIDAZOLAM HCL 5 MG/5ML IJ SOLN
INTRAMUSCULAR | Status: DC | PRN
  Administered 2015-05-20: 1 mg via INTRAVENOUS

## 2015-05-20 MED ORDER — TETRACAINE 0.5 % OP SOLN OPTIME - NO CHARGE
OPHTHALMIC | Status: DC | PRN
  Administered 2015-05-20: 2 [drp] via OPHTHALMIC

## 2015-05-20 SURGICAL SUPPLY — 10 items

## 2015-05-20 NOTE — Transfer of Care (Signed)
Immediate Anesthesia Transfer of Care Note  Patient: Allison [name]  Procedure(s) Performed: Procedure(s) with comments: CATARACT EXTRACTION PHACO AND INTRAOCULAR LENS PLACEMENT (IOC) (Left) - CDE:5.41  Patient Location: Short Stay  Anesthesia Type:MAC  Level of Consciousness: awake  Airway & Oxygen Therapy: Patient Spontanous Breathing  Post-op Assessment: Report given to RN  Post vital signs: Reviewed  Last Vitals:  Filed Vitals:   05/20/15 1015 05/20/15 1020  BP: 149/86 151/85  Pulse:    Temp:    Resp: 16 17    Complications: No apparent anesthesia complications

## 2015-05-20 NOTE — Discharge Instructions (Signed)
°  °          [facility] Instructions °[address]- [place] [address] °    ° °1. Avoid closing eyes tightly. One often closes the eye tightly when laughing, talking, sneezing, coughing or if they feel irritated. At these times, you should be careful not to close your eyes tightly. ° °2. Instill eye drops as instructed. To instill drops in your eye, open it, look up and have someone gently pull the lower lid down and instill a couple of drops inside the lower lid. ° °3. Do not touch upper lid. ° °4. Take Advil or Tylenol for pain. ° °5. You may use either eye for near work, such as reading or sewing and you may watch television. ° °6. You may have your hair done at the beauty parlor at any time. ° °7. Wear dark glasses with or without your own glasses if you are in bright light. ° °8. Call our office at [phone] or [phone] if you have sharp pain in your eye or unusual symptoms. ° °9.  FOLLOW UP WITH DR. SHAPIRO TODAY IN HIS  OFFICE AT 2:45pm. ° °  °I have received a copy of the above instructions and will follow them.  ° ° ° °IF YOU ARE IN IMMEDIATE DANGER CALL 911! ° °It is important for you to keep your follow-up appointment with your physician after discharge, OR, for you /your caregiver to make a follow-up appointment with your physician / medical provider after discharge. ° °Show these instructions to the next healthcare provider you see. ° °

## 2015-05-20 NOTE — Op Note (Signed)
Patient brought to the operating room and prepped and draped in the usual manner.  Lid speculum inserted in left eye.  Stab incision made at the twelve o'clock position.  Provisc instilled in the anterior chamber.   A 2.4 mm. Stab incision was made temporally.  An anterior capsulotomy was done with a bent 25 gauge needle.  The nucleus was hydrodissected.  The Phaco tip was inserted in the anterior chamber and the nucleus was emulsified.  CDE was 5.41.  The cortical material was then removed with the I and A tip.  Posterior capsule was the polished.  The anterior chamber was deepened with Provisc.  A 21.5 Diopter Alcon [id number] IOL was then inserted in the capsular bag.  Provisc was then removed with the I and A tip.  The wound was then hydrated.  Patient sent to the Recovery Room in good condition with follow up in my office.  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

## 2015-05-20 NOTE — Anesthesia Postprocedure Evaluation (Signed)
Anesthesia Post Note  Patient: Allison [name]  Procedure(s) Performed: Procedure(s) (LRB): CATARACT EXTRACTION PHACO AND INTRAOCULAR LENS PLACEMENT (IOC) (Left)  Patient location during evaluation: Short Stay Anesthesia Type: MAC Level of consciousness: awake and alert Pain management: pain level controlled Vital Signs Assessment: post-procedure vital signs reviewed and stable Respiratory status: spontaneous breathing Cardiovascular status: blood pressure returned to baseline Postop Assessment: no signs of nausea or vomiting Anesthetic complications: no    Last Vitals:  Filed Vitals:   05/20/15 1015 05/20/15 1020  BP: 149/86 151/85  Pulse:    Temp:    Resp: 16 17    Last Pain: There were no vitals filed for this visit.               [name]

## 2015-05-20 NOTE — Anesthesia Preprocedure Evaluation (Signed)
Anesthesia Evaluation  Patient identified by MRN, date of birth, ID band Patient awake    Reviewed: Allergy & Precautions, NPO status , Patient's Chart, lab work & pertinent test results  Airway Mallampati: IV  TM Distance: >3 FB     Dental  (+) Edentulous Upper, Edentulous Lower, Upper Dentures, Lower Dentures   Pulmonary shortness of breath and with exertion,    Pulmonary exam normal        Cardiovascular hypertension, Pt. on medications Normal cardiovascular exam     Neuro/Psych    GI/Hepatic GERD  Medicated and Controlled,  Endo/Other  Morbid obesity  Renal/GU      Musculoskeletal   Abdominal Normal abdominal exam  (+)   Peds  Hematology   Anesthesia Other Findings   Reproductive/Obstetrics                             Anesthesia Physical Anesthesia Plan  ASA: III  Anesthesia Plan: MAC   Post-op Pain Management:    Induction: Intravenous  Airway Management Planned: Nasal Cannula  Additional Equipment:   Intra-op Plan:   Post-operative Plan:   Informed Consent: I have reviewed the patients History and Physical, chart, labs and discussed the procedure including the risks, benefits and alternatives for the proposed anesthesia with the patient or authorized representative who has indicated his/her understanding and acceptance.   Dental advisory given  Plan Discussed with: CRNA  Anesthesia Plan Comments:         Anesthesia Quick Evaluation

## 2015-05-20 NOTE — H&P (Signed)
The patient was re examined and there is no change in the patients condition since the original H and P. 

## 2015-05-21 ENCOUNTER — Encounter (HOSPITAL_COMMUNITY): Payer: Self-pay | Admitting: Ophthalmology

## 2015-07-25 DIAGNOSIS — R7301 Impaired fasting glucose: Secondary | ICD-10-CM | POA: Diagnosis not present

## 2015-07-25 DIAGNOSIS — E785 Hyperlipidemia, unspecified: Secondary | ICD-10-CM | POA: Diagnosis not present

## 2015-07-25 DIAGNOSIS — I10 Essential (primary) hypertension: Secondary | ICD-10-CM | POA: Diagnosis not present

## 2015-07-25 LAB — HEMOGLOBIN A1C
HEMOGLOBIN A1C: 5.5 % (ref <5.7)
MEAN PLASMA GLUCOSE: 111 mg/dL

## 2015-07-26 LAB — LIPID PANEL
Cholesterol: 154 mg/dL (ref 125–200)
HDL: 68 mg/dL (ref >=46)
LDL CALC: 72 mg/dL (ref <130)
TRIGLYCERIDES: 70 mg/dL (ref <150)
Total CHOL/HDL Ratio: 2.3 Ratio (ref <=5.0)
VLDL: 14 mg/dL (ref <30)

## 2015-07-26 LAB — COMPLETE METABOLIC PANEL WITH GFR
ALK PHOS: 77 U/L (ref 33–130)
ALT: 11 U/L (ref 6–29)
AST: 12 U/L (ref 10–35)
Albumin: 4.2 g/dL (ref 3.6–5.1)
BILIRUBIN TOTAL: 1.3 mg/dL — AB (ref 0.2–1.2)
BUN: 13 mg/dL (ref 7–25)
CO2: 25 mmol/L (ref 20–31)
Calcium: 9.1 mg/dL (ref 8.6–10.4)
Chloride: 105 mmol/L (ref 98–110)
Creat: 0.76 mg/dL (ref 0.60–0.93)
GFR, Est African American: 88 mL/min (ref >=60)
GFR, Est Non African American: 76 mL/min (ref >=60)
GLUCOSE: 107 mg/dL — AB (ref 65–99)
POTASSIUM: 4.1 mmol/L (ref 3.5–5.3)
SODIUM: 141 mmol/L (ref 135–146)
TOTAL PROTEIN: 7.4 g/dL (ref 6.1–8.1)

## 2015-07-31 ENCOUNTER — Encounter: Payer: Self-pay | Admitting: Family Medicine

## 2015-07-31 ENCOUNTER — Ambulatory Visit (INDEPENDENT_AMBULATORY_CARE_PROVIDER_SITE_OTHER): Payer: Medicare Other | Admitting: Family Medicine

## 2015-07-31 VITALS — BP 134/80 | HR 74 | Resp 18 | Ht 62.5 in | Wt 200.0 lb

## 2015-07-31 DIAGNOSIS — G4700 Insomnia, unspecified: Secondary | ICD-10-CM

## 2015-07-31 DIAGNOSIS — F4320 Adjustment disorder, unspecified: Secondary | ICD-10-CM

## 2015-07-31 DIAGNOSIS — Z1211 Encounter for screening for malignant neoplasm of colon: Secondary | ICD-10-CM | POA: Diagnosis not present

## 2015-07-31 DIAGNOSIS — Z0000 Encounter for general adult medical examination without abnormal findings: Secondary | ICD-10-CM | POA: Insufficient documentation

## 2015-07-31 DIAGNOSIS — F4321 Adjustment disorder with depressed mood: Secondary | ICD-10-CM | POA: Insufficient documentation

## 2015-07-31 LAB — HEMOCCULT GUIAC POC 1CARD (OFFICE): Fecal Occult Blood, POC: NEGATIVE

## 2015-07-31 MED ORDER — AMLODIPINE BESYLATE 2.5 MG PO TABS: 2.5000 mg | ORAL_TABLET | Freq: Every day | ORAL | Status: DC

## 2015-07-31 MED ORDER — TRAZODONE HCL 50 MG PO TABS: 50.0000 mg | ORAL_TABLET | Freq: Every day | ORAL | Status: DC

## 2015-07-31 MED ORDER — PRAVASTATIN SODIUM 20 MG PO TABS: ORAL_TABLET | ORAL | Status: DC

## 2015-07-31 NOTE — Assessment & Plan Note (Signed)

## 2015-07-31 NOTE — Patient Instructions (Signed)
Follow up in 3 m months, call if you need me sooner  New medication, at bedtime to help with sleep as well as your healthy grief reaction to losing your partner of over 50 years  Labs and exam are very good Please work on good  health habits so that your health will improve. 1. Commitment to daily physical activity for 30 to 60  minutes, if you are able to do this.  2. Commitment to wise food choices. Aim for half of your  food intake to be vegetable and fruit, one quarter starchy foods, and one quarter protein. Try to eat on a regular schedule  3 meals per day, snacking between meals should be limited to vegetables or fruits or small portions of nuts. 64 ounces of water per day is generally recommended, unless you have specific health conditions, like heart failure or kidney failure where you will need to limit fluid intake.  3. Commitment to sufficient and a  good quality of physical and mental rest daily, generally between 6 to 8 hours per day.  WITH PERSISTANCE AND PERSEVERANCE, THE IMPOSSIBLE , BECOMES THE NORM!

## 2015-07-31 NOTE — Assessment & Plan Note (Signed)
Sleep hygiene reviewed and written information offered also. Prescription sent for  medication needed.  
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
## 2015-07-31 NOTE — Assessment & Plan Note (Addendum)
Healthy grieving , poor sleep and appetite, with a PHQ 9 score of 7 , following loss of her partner of over 50 year. start trazodone at bedtime Counseling offered but she declined, and will rely on family and friends, which seems appropriate at this time

## 2015-09-23 DIAGNOSIS — Z961 Presence of intraocular lens: Secondary | ICD-10-CM | POA: Diagnosis not present

## 2015-11-05 ENCOUNTER — Ambulatory Visit: Payer: Self-pay | Admitting: Family Medicine

## 2016-01-08 ENCOUNTER — Encounter: Payer: Self-pay | Admitting: Family Medicine

## 2016-01-08 ENCOUNTER — Ambulatory Visit (INDEPENDENT_AMBULATORY_CARE_PROVIDER_SITE_OTHER): Payer: Medicare Other | Admitting: Family Medicine

## 2016-01-08 VITALS — BP 138/82 | HR 72 | Resp 16 | Ht 63.0 in | Wt 201.1 lb

## 2016-01-08 DIAGNOSIS — I10 Essential (primary) hypertension: Secondary | ICD-10-CM | POA: Diagnosis not present

## 2016-01-08 DIAGNOSIS — E6609 Other obesity due to excess calories: Secondary | ICD-10-CM

## 2016-01-08 DIAGNOSIS — R7301 Impaired fasting glucose: Secondary | ICD-10-CM

## 2016-01-08 DIAGNOSIS — E559 Vitamin D deficiency, unspecified: Secondary | ICD-10-CM

## 2016-01-08 DIAGNOSIS — Z23 Encounter for immunization: Secondary | ICD-10-CM

## 2016-01-08 DIAGNOSIS — E785 Hyperlipidemia, unspecified: Secondary | ICD-10-CM

## 2016-01-08 DIAGNOSIS — IMO0001 Reserved for inherently not codable concepts without codable children: Secondary | ICD-10-CM

## 2016-01-08 DIAGNOSIS — Z1231 Encounter for screening mammogram for malignant neoplasm of breast: Secondary | ICD-10-CM

## 2016-01-08 NOTE — Assessment & Plan Note (Signed)
Controlled, no change in medication DASH diet and commitment to daily physical activity for a minimum of 30 minutes discussed and encouraged, as a part of hypertension management. The importance of attaining a healthy weight is also discussed.  BP/Weight 01/08/2016 07/31/2015 05/20/2015 05/16/2015 03/04/2015 02/20/2015 99991111  Systolic BP 0000000 Q000111Q Q000111Q [id number]  Diastolic BP 82 80 78 64 81 74 68  Wt. (Lbs) 201.12 200 198 198 - 199.12 202.12  BMI 35.63 35.97 35.08 35.08 - 35.82 36.68

## 2016-01-08 NOTE — Assessment & Plan Note (Signed)
Hyperlipidemia:Low fat diet discussed and encouraged. Controlled, no change in medication   Lipid Panel  Lab Results  Component Value Date   CHOL 154 07/25/2015   HDL 68 07/25/2015   LDLCALC 72 07/25/2015   TRIG 70 07/25/2015   CHOLHDL 2.3 07/25/2015

## 2016-01-08 NOTE — Progress Notes (Signed)
   Allison [name]     MRN: [id number]      DOB: March 28, 1940   HPI Allison [name] is here for follow up and re-evaluation of chronic medical conditions, medication management and review of any available recent lab and radiology data.  Preventive health is updated, specifically  Cancer screening and Immunization.   Questions or concerns regarding consultations or procedures which the PT has had in the interim are  addressed. The PT c/o hard stool with constipation approx once every 2 weeks, feels it is most liley from medication, however will modify diet and lifestyle to address this.  There are no new concerns.  There are no specific complaints   ROS Denies recent fever or chills. Denies sinus pressure, nasal congestion, ear pain or sore throat. Denies chest congestion, productive cough or wheezing. Denies chest pains, palpitations and leg swelling Denies abdominal pain, nausea, vomiting,diarrhea  Denies dysuria, frequency, hesitancy or incontinence. Denies joint pain, swelling and limitation in mobility. Denies headaches, seizures, numbness, or tingling. Denies depression, anxiety or insomnia. Denies skin break down or rash.   PE  BP 138/82   Pulse 72   Resp 16   Ht 5\' 3"  (1.6 m)   Wt 201 lb 1.9 oz (91.2 kg)   SpO2 96%   BMI 35.63 kg/m   Patient alert and oriented and in no cardiopulmonary distress.  HEENT: No facial asymmetry, EOMI,   oropharynx pink and moist.  Neck supple no JVD, no mass.  Chest: Clear to auscultation bilaterally.  CVS: S1, S2 no murmurs, no S3.Regular rate.  ABD: Soft non tender.   Ext: No edema  MS: Adequate ROM spine, shoulders, hips and knees.  Skin: Intact, no ulcerations or rash noted.  Psych: Good eye contact, normal affect. Memory intact not anxious or depressed appearing.  CNS: CN 2-12 intact, power,  normal throughout.no focal deficits noted.   Assessment & Plan  HTN (hypertension) Controlled, no change in medication DASH diet and  commitment to daily physical activity for a minimum of 30 minutes discussed and encouraged, as a part of hypertension management. The importance of attaining a healthy weight is also discussed.  BP/Weight 01/08/2016 07/31/2015 05/20/2015 05/16/2015 03/04/2015 02/20/2015 [id number]  Systolic BP 0000000 Q000111Q Q000111Q [id number]  Diastolic BP 82 80 78 64 81 74 68  Wt. (Lbs) 201.12 200 198 198 - 199.12 202.12  BMI 35.63 35.97 35.08 35.08 - 35.82 36.68       Hyperlipidemia LDL goal <100 Hyperlipidemia:Low fat diet discussed and encouraged. Controlled, no change in medication   Lipid Panel  Lab Results  Component Value Date   CHOL 154 07/25/2015   HDL 68 07/25/2015   LDLCALC 72 07/25/2015   TRIG 70 07/25/2015   CHOLHDL 2.3 07/25/2015       Obesity Deteriorated. Patient re-educated about  the importance of commitment to a  minimum of 150 minutes of exercise per week.  The importance of healthy food choices with portion control discussed. Encouraged to start a food diary, count calories and to consider  joining a support group. Sample diet sheets offered. Goals set by the patient for the next several months.   Weight /BMI 01/08/2016 07/31/2015 05/20/2015  WEIGHT 201 lb 1.9 oz 200 lb 198 lb  HEIGHT 5\' 3"  5' 2.5" 5\' 3"   BMI 35.63 kg/m2 35.97 kg/m2 35.08 kg/m2

## 2016-01-08 NOTE — Patient Instructions (Addendum)
Annual wellness first week in February, call if you need me sooner  Flu vaccine today  It is important that you exercise regularly at least 30 minutes 5 times a week. If you develop chest pain, have severe difficulty breathing, or feel very tired, stop exercising immediately and seek medical attention    Fasting lipid, cmp and EGFr,hBA1C and TSH Jan 29 or after   You are referred for mammogram which is due in [place]  Thank you  for choosing [facility]. We consider it a privelige to serve you.  Delivering excellent health care in a caring and  compassionate way is our goal.  Partnering with you,  so that together we can achieve this goal is our strategy.    Constipation, Adult Constipation is when a person:  Poops (has a bowel movement) fewer times in a week than normal.  Has a hard time pooping.  Has poop that is dry, hard, or bigger than normal. Follow these instructions at home: Eating and drinking  Eat foods that have a lot of fiber, such as:  Fresh fruits and vegetables.  Whole grains.  Beans.  Eat less of foods that are high in fat, low in fiber, or overly processed, such as:  [place] fries.  Hamburgers.  Cookies.  Candy.  Soda.  Drink enough fluid to keep your pee (urine) clear or pale yellow. General instructions  Exercise regularly or as told by your doctor.  Go to the restroom when you feel like you need to poop. Do not hold it in.  Take over-the-counter and prescription medicines only as told by your doctor. These include any fiber supplements.  Do pelvic floor retraining exercises, such as:  Doing deep breathing while relaxing your lower belly (abdomen).  Relaxing your pelvic floor while pooping.  Watch your condition for any changes.  Keep all follow-up visits as told by your doctor. This is important. Contact a doctor if:  You have pain that gets worse.  You have a fever.  You have not pooped for 4 days.  You throw  up (vomit).  You are not hungry.  You lose weight.  You are bleeding from the anus.  You have thin, pencil-like poop (stool). Get help right away if:  You have a fever, and your symptoms suddenly get worse.  You leak poop or have blood in your poop.  Your belly feels hard or bigger than normal (is bloated).  You have very bad belly pain.  You feel dizzy or you faint. This information is not intended to replace advice given to you by your health care provider. Make sure you discuss any questions you have with your health care provider. Document Released: 06/30/2007 Document Revised: 08/01/2015 Document Reviewed: 07/02/2015 Elsevier Interactive Patient Education  2017 [employer].

## 2016-01-08 NOTE — Assessment & Plan Note (Signed)
Deteriorated. Patient re-educated about  the importance of commitment to a  minimum of 150 minutes of exercise per week.  The importance of healthy food choices with portion control discussed. Encouraged to start a food diary, count calories and to consider  joining a support group. Sample diet sheets offered. Goals set by the patient for the next several months.   Weight /BMI 01/08/2016 07/31/2015 05/20/2015  WEIGHT 201 lb 1.9 oz 200 lb 198 lb  HEIGHT 5\' 3"  5' 2.5" 5\' 3"   BMI 35.63 kg/m2 35.97 kg/m2 35.08 kg/m2

## 2016-02-23 DIAGNOSIS — I10 Essential (primary) hypertension: Secondary | ICD-10-CM | POA: Diagnosis not present

## 2016-02-23 DIAGNOSIS — E785 Hyperlipidemia, unspecified: Secondary | ICD-10-CM | POA: Diagnosis not present

## 2016-02-23 DIAGNOSIS — R7301 Impaired fasting glucose: Secondary | ICD-10-CM | POA: Diagnosis not present

## 2016-02-23 LAB — TSH: TSH: 1.87 m[IU]/L

## 2016-02-23 LAB — COMPLETE METABOLIC PANEL WITH GFR
ALT: 12 U/L (ref 6–29)
AST: 12 U/L (ref 10–35)
Albumin: 4.2 g/dL (ref 3.6–5.1)
Alkaline Phosphatase: 70 U/L (ref 33–130)
BUN: 13 mg/dL (ref 7–25)
CHLORIDE: 106 mmol/L (ref 98–110)
CO2: 29 mmol/L (ref 20–31)
Calcium: 9.5 mg/dL (ref 8.6–10.4)
Creat: 0.86 mg/dL (ref 0.60–0.93)
GFR, EST AFRICAN AMERICAN: 75 mL/min (ref >=60)
GFR, EST NON AFRICAN AMERICAN: 65 mL/min (ref >=60)
GLUCOSE: 98 mg/dL (ref 65–99)
Potassium: 4.4 mmol/L (ref 3.5–5.3)
SODIUM: 141 mmol/L (ref 135–146)
Total Bilirubin: 1.2 mg/dL (ref 0.2–1.2)
Total Protein: 7.8 g/dL (ref 6.1–8.1)

## 2016-02-23 LAB — LIPID PANEL
Cholesterol: 166 mg/dL (ref <200)
HDL: 58 mg/dL (ref >50)
LDL CALC: 91 mg/dL (ref <100)
TRIGLYCERIDES: 85 mg/dL (ref <150)
Total CHOL/HDL Ratio: 2.9 Ratio (ref <5.0)
VLDL: 17 mg/dL (ref <30)

## 2016-02-23 LAB — HEMOGLOBIN A1C
Hgb A1c MFr Bld: 5.3 % (ref <5.7)
MEAN PLASMA GLUCOSE: 105 mg/dL

## 2016-02-26 ENCOUNTER — Telehealth: Payer: Self-pay | Admitting: Family Medicine

## 2016-03-01 ENCOUNTER — Ambulatory Visit (INDEPENDENT_AMBULATORY_CARE_PROVIDER_SITE_OTHER): Payer: Medicare Other

## 2016-03-01 VITALS — BP 130/78 | HR 81 | Temp 98.8°F | Ht 63.0 in | Wt 202.1 lb

## 2016-03-01 DIAGNOSIS — Z0000 Encounter for general adult medical examination without abnormal findings: Secondary | ICD-10-CM | POA: Diagnosis not present

## 2016-03-01 NOTE — Patient Instructions (Addendum)
Health maintenance: Due for repeat bone density, please call our office if you change your mind and want to repeat this. Mammogram was ordered by Dr. Moshe Cipro, please call [facility] and schedule this.  Abnormal screenings: None   Patient concerns: None   Nurse concerns: None   Next PCP appt: Follow up in 5 months with Dr. Moshe Cipro for your annual physical and follow up in 1 year for your annual wellness visit.  Advance directive discussed with patient today. Copy provided for patient to complete at home and have notarized. Patient agrees to have copy sent to our office once it is complete.    Preventive Care 40 Years and Older, Female Preventive care refers to lifestyle choices and visits with your health care provider that can promote health and wellness. What does preventive care include?  A yearly physical exam. This is also called an annual well check.  Dental exams once or twice a year.  Routine eye exams. Ask your health care provider how often you should have your eyes checked.  Personal lifestyle choices, including:  Daily care of your teeth and gums.  Regular physical activity.  Eating a healthy diet.  Avoiding tobacco and drug use.  Limiting alcohol use.  Practicing safe sex.  Taking low-dose aspirin every day.  Taking vitamin and mineral supplements as recommended by your health care provider. What happens during an annual well check? The services and screenings done by your health care provider during your annual well check will depend on your age, overall health, lifestyle risk factors, and family history of disease. Counseling  Your health care provider may ask you questions about your:  Alcohol use.  Tobacco use.  Drug use.  Emotional well-being.  Home and relationship well-being.  Sexual activity.  Eating habits.  History of falls.  Memory and ability to understand (cognition).  Work and work Statistician.  Reproductive  health. Screening  You may have the following tests or measurements:  Height, weight, and BMI.  Blood pressure.  Lipid and cholesterol levels. These may be checked every 5 years, or more frequently if you are over 64 years old.  Skin check.  Lung cancer screening. You may have this screening every year starting at age 31 if you have a 30-pack-year history of smoking and currently smoke or have quit within the past 15 years.  Fecal occult blood test (FOBT) of the stool. You may have this test every year starting at age 37.  Flexible sigmoidoscopy or colonoscopy. You may have a sigmoidoscopy every 5 years or a colonoscopy every 10 years starting at age 42.  Hepatitis C blood test.  Diabetes screening. This is done by checking your blood sugar (glucose) after you have not eaten for a while (fasting). You may have this done every 1-3 years.  Bone density scan. This is done to screen for osteoporosis. You may have this done starting at age 18.  Mammogram. This may be done every 1-2 years. Talk to your health care provider about how often you should have regular mammograms. Talk with your health care provider about your test results, treatment options, and if necessary, the need for more tests. Vaccines  Your health care provider may recommend certain vaccines, such as:  Influenza vaccine. This is recommended every year.  Tetanus, diphtheria, and acellular pertussis (Tdap, Td) vaccine. You may need a Td booster every 10 years.  Zoster vaccine. You may need this after age 69.  Measles, mumps, and rubella (MMR) vaccine. You may  need at least one dose of MMR if you were born in 1957 or later. You may also need a second dose.  Pneumococcal 13-valent conjugate (PCV13) vaccine. One dose is recommended after age 58.  Pneumococcal polysaccharide (PPSV23) vaccine. One dose is recommended after age 53.  Talk to your health care provider about which screenings and vaccines you need and how  often you need them. This information is not intended to replace advice given to you by your health care provider. Make sure you discuss any questions you have with your health care provider. Document Released: 02/07/2015 Document Revised: 10/01/2015 Document Reviewed: 11/12/2014 Elsevier Interactive Patient Education  2017 [employer].
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
## 2016-03-02 ENCOUNTER — Ambulatory Visit: Payer: Self-pay

## 2016-03-08 ENCOUNTER — Ambulatory Visit (HOSPITAL_COMMUNITY)
Admission: RE | Admit: 2016-03-08 | Discharge: 2016-03-08 | Disposition: A | Discharge status: Home / Self Care | Payer: Medicare Other | Source: Ambulatory Visit | Attending: Family Medicine | Admitting: Family Medicine

## 2016-03-08 DIAGNOSIS — Z1231 Encounter for screening mammogram for malignant neoplasm of breast: Secondary | ICD-10-CM

## 2016-04-27 ENCOUNTER — Other Ambulatory Visit: Payer: Self-pay | Admitting: Family Medicine

## 2016-08-03 ENCOUNTER — Encounter: Payer: Self-pay | Admitting: Family Medicine

## 2016-08-10 ENCOUNTER — Encounter: Payer: Self-pay | Admitting: Family Medicine

## 2016-08-11 ENCOUNTER — Telehealth: Payer: Self-pay | Admitting: Family Medicine

## 2016-08-11 NOTE — Telephone Encounter (Signed)
I have made several attempts to reach patient so that her CPE could be rescheduled.  I left a message at  [phone] and also tried to reach her on  [phone] but mailbox is full. I will try her later this week if she does not call back today.

## 2016-08-23 ENCOUNTER — Telehealth: Payer: Self-pay | Admitting: *Deleted

## 2016-08-23 DIAGNOSIS — R7301 Impaired fasting glucose: Secondary | ICD-10-CM

## 2016-08-23 DIAGNOSIS — E785 Hyperlipidemia, unspecified: Secondary | ICD-10-CM

## 2016-08-23 DIAGNOSIS — I10 Essential (primary) hypertension: Secondary | ICD-10-CM

## 2016-08-23 DIAGNOSIS — H543 Unqualified visual loss, both eyes: Secondary | ICD-10-CM

## 2016-08-23 NOTE — Telephone Encounter (Signed)
Patient called wanting to know if she needs blood work done before her appt in aug. Please advise

## 2016-08-25 NOTE — Telephone Encounter (Signed)
Patient aware.

## 2016-09-08 DIAGNOSIS — R7301 Impaired fasting glucose: Secondary | ICD-10-CM | POA: Diagnosis not present

## 2016-09-08 DIAGNOSIS — I10 Essential (primary) hypertension: Secondary | ICD-10-CM | POA: Diagnosis not present

## 2016-09-08 DIAGNOSIS — E785 Hyperlipidemia, unspecified: Secondary | ICD-10-CM | POA: Diagnosis not present

## 2016-09-08 LAB — CBC
HEMATOCRIT: 41.5 % (ref 35.0–45.0)
HEMOGLOBIN: 14 g/dL (ref 11.7–15.5)
MCH: 29.6 pg (ref 27.0–33.0)
MCHC: 33.7 g/dL (ref 32.0–36.0)
MCV: 87.7 fL (ref 80.0–100.0)
MPV: 10.5 fL (ref 7.5–12.5)
Platelets: 400 10*3/uL (ref 140–400)
RBC: 4.73 MIL/uL (ref 3.80–5.10)
RDW: 13.5 % (ref 11.0–15.0)
WBC: 6.4 10*3/uL (ref 3.8–10.8)

## 2016-09-09 LAB — COMPLETE METABOLIC PANEL WITH GFR
ALT: 11 U/L (ref 6–29)
AST: 12 U/L (ref 10–35)
Albumin: 4.2 g/dL (ref 3.6–5.1)
Alkaline Phosphatase: 79 U/L (ref 33–130)
BILIRUBIN TOTAL: 1.3 mg/dL — AB (ref 0.2–1.2)
BUN: 15 mg/dL (ref 7–25)
CALCIUM: 9.4 mg/dL (ref 8.6–10.4)
CO2: 25 mmol/L (ref 20–32)
CREATININE: 0.8 mg/dL (ref 0.60–0.93)
Chloride: 105 mmol/L (ref 98–110)
GFR, EST AFRICAN AMERICAN: 82 mL/min (ref >=60)
GFR, EST NON AFRICAN AMERICAN: 71 mL/min (ref >=60)
Glucose, Bld: 91 mg/dL (ref 65–99)
Potassium: 4.3 mmol/L (ref 3.5–5.3)
Sodium: 140 mmol/L (ref 135–146)
TOTAL PROTEIN: 7.4 g/dL (ref 6.1–8.1)

## 2016-09-09 LAB — HEMOGLOBIN A1C
HEMOGLOBIN A1C: 5.2 % (ref <5.7)
Mean Plasma Glucose: 103 mg/dL

## 2016-09-09 LAB — LIPID PANEL
CHOL/HDL RATIO: 2.9 ratio (ref <5.0)
CHOLESTEROL: 177 mg/dL (ref <200)
HDL: 61 mg/dL (ref >50)
LDL Cholesterol: 97 mg/dL (ref <100)
Triglycerides: 94 mg/dL (ref <150)
VLDL: 19 mg/dL (ref <30)

## 2016-09-14 ENCOUNTER — Encounter: Payer: Self-pay | Admitting: Family Medicine

## 2016-09-14 ENCOUNTER — Ambulatory Visit (INDEPENDENT_AMBULATORY_CARE_PROVIDER_SITE_OTHER): Payer: Medicare Other | Admitting: Family Medicine

## 2016-09-14 VITALS — BP 128/80 | HR 70 | Resp 16 | Ht 63.0 in | Wt 198.0 lb

## 2016-09-14 DIAGNOSIS — Z23 Encounter for immunization: Secondary | ICD-10-CM

## 2016-09-14 DIAGNOSIS — Z1211 Encounter for screening for malignant neoplasm of colon: Secondary | ICD-10-CM | POA: Diagnosis not present

## 2016-09-14 DIAGNOSIS — Z0000 Encounter for general adult medical examination without abnormal findings: Secondary | ICD-10-CM

## 2016-09-14 LAB — POC HEMOCCULT BLD/STL (OFFICE/1-CARD/DIAGNOSTIC): FECAL OCCULT BLD: NEGATIVE

## 2016-09-14 NOTE — Progress Notes (Signed)
    Allison [name]     MRN: [id number]      DOB: [birth year]  HPI: Patient is in for annual physical exam. No other health concerns are expressed or addressed at the visit. Recent labs, if available are reviewed. Immunization is reviewed , and  updated if needed.   PE: BP 128/80   Pulse 70   Resp 16   Ht 5\' 3"  (1.6 m)   Wt 198 lb (89.8 kg)   SpO2 97%   BMI 35.07 kg/m   Pleasant  female, alert and oriented x 3, in no cardio-pulmonary distress. Afebrile. HEENT No facial trauma or asymetry. Sinuses non tender.  Extra occullar muscles intact, pupils equally reactive to light. External ears normal, tympanic membranes clear. Oropharynx moist, no exudate. Neck: supple, no adenopathy,JVD or thyromegaly.No bruits.  Chest: Clear to ascultation bilaterally.No crackles or wheezes. Non tender to palpation  Breast: No asymetry,no masses or lumps. No tenderness. No nipple discharge or inversion. No axillary or supraclavicular adenopathy  Cardiovascular system; Heart sounds normal,  S1 and  S2 ,no S3.  No murmur, or thrill. Apical beat not displaced Peripheral pulses normal.  Abdomen: Soft, non tender, no organomegaly or masses. No bruits. Bowel sounds normal. No guarding, tenderness or rebound.  Rectal:  Normal sphincter tone. No rectal mass. Guaiac negative stool.  GU: Not examined   Musculoskeletal exam: Full ROM of spine, hips , shoulders and knees. No deformity ,swelling or crepitus noted. No muscle wasting or atrophy.   Neurologic: Cranial nerves 2 to 12 intact. Power, tone ,sensation and reflexes normal throughout. No disturbance in gait. No tremor.  Skin: Intact, no ulceration, erythema , scaling or rash noted. Pigmentation normal throughout  Psych; Normal mood and affect. Judgement and concentration normal   Assessment & Plan:  No problem-specific Assessment & Plan notes found for this encounter.

## 2016-09-14 NOTE — Patient Instructions (Addendum)
Wellness with nurse in January, call if you need me sooner  mD visit in early March  Please work on good  health habits so that your health will improve. 1. Commitment to daily physical activity for 30 to 60  minutes, if you are able to do this.  2. Commitment to wise food choices. Aim for half of your  food intake to be vegetable and fruit, one quarter starchy foods, and one quarter protein. Try to eat on a regular schedule  3 meals per day, snacking between meals should be limited to vegetables or fruits or small portions of nuts. 64 ounces of water per day is generally recommended, unless you have specific health conditions, like heart failure or kidney failure where you will need to limit fluid intake.  3. Commitment to sufficient and a  good quality of physical and mental rest daily, generally between 6 to 8 hours per day.  WITH PERSISTANCE AND PERSEVERANCE, THE IMPOSSIBLE , BECOMES THE NORM! It is important that you exercise regularly at least 30 minutes 5 times a week. If you develop chest pain, have severe difficulty breathing, or feel very tired, stop exercising immediately and seek medical attention  Excelent labs  Flu vaccine today

## 2016-09-14 NOTE — Assessment & Plan Note (Signed)
Annual exam as documented. . Immunization and cancer screening needs are specifically addressed at this visit.  

## 2016-10-27 NOTE — Telephone Encounter (Signed)
Issue resolved.

## 2016-11-17 ENCOUNTER — Other Ambulatory Visit: Payer: Self-pay | Admitting: Family Medicine

## 2016-11-23 DIAGNOSIS — H524 Presbyopia: Secondary | ICD-10-CM | POA: Diagnosis not present

## 2016-12-29 ENCOUNTER — Encounter: Payer: Self-pay | Admitting: Family Medicine

## 2017-01-31 ENCOUNTER — Ambulatory Visit: Payer: Self-pay

## 2017-02-28 ENCOUNTER — Ambulatory Visit: Payer: Self-pay

## 2017-03-02 ENCOUNTER — Ambulatory Visit: Payer: Self-pay

## 2017-03-29 ENCOUNTER — Ambulatory Visit (INDEPENDENT_AMBULATORY_CARE_PROVIDER_SITE_OTHER): Payer: Medicare Other | Admitting: Family Medicine

## 2017-03-29 ENCOUNTER — Encounter: Payer: Self-pay | Admitting: Family Medicine

## 2017-03-29 VITALS — BP 162/90 | HR 79 | Resp 16 | Ht 63.0 in | Wt 189.0 lb

## 2017-03-29 DIAGNOSIS — E669 Obesity, unspecified: Secondary | ICD-10-CM | POA: Diagnosis not present

## 2017-03-29 DIAGNOSIS — I10 Essential (primary) hypertension: Secondary | ICD-10-CM | POA: Diagnosis not present

## 2017-03-29 DIAGNOSIS — R7301 Impaired fasting glucose: Secondary | ICD-10-CM

## 2017-03-29 DIAGNOSIS — Z1231 Encounter for screening mammogram for malignant neoplasm of breast: Secondary | ICD-10-CM | POA: Diagnosis not present

## 2017-03-29 DIAGNOSIS — E559 Vitamin D deficiency, unspecified: Secondary | ICD-10-CM

## 2017-03-29 DIAGNOSIS — E785 Hyperlipidemia, unspecified: Secondary | ICD-10-CM

## 2017-03-29 MED ORDER — AMLODIPINE BESYLATE 2.5 MG PO TABS: 2.5000 mg | ORAL_TABLET | Freq: Every day | ORAL | 3 refills | Status: DC

## 2017-03-29 NOTE — Patient Instructions (Addendum)
Wellness with nurse March 18 as before  Fasting lipid, cmp and EGFr, TSH and vit D in am  Please resume medication for blood pressure today, take at same time every day  Please schedule mammogram at checkout   Physical with MD 8/22 or after  It is important that you exercise regularly at least 30 minutes 5 times a week. If you develop chest pain, have severe difficulty breathing, or feel very tired, stop exercising immediately and seek medical attention  Thank you  for choosing [facility]. We consider it a privelige to serve you.  Delivering excellent health care in a caring and  compassionate way is our goal.  Partnering with you,  so that together we can achieve this goal is our strategy.

## 2017-03-30 DIAGNOSIS — E559 Vitamin D deficiency, unspecified: Secondary | ICD-10-CM | POA: Diagnosis not present

## 2017-03-30 DIAGNOSIS — I10 Essential (primary) hypertension: Secondary | ICD-10-CM | POA: Diagnosis not present

## 2017-03-31 LAB — COMPLETE METABOLIC PANEL WITH GFR
AG RATIO: 1.3 (calc) (ref 1.0–2.5)
ALBUMIN MSPROF: 4.2 g/dL (ref 3.6–5.1)
ALT: 12 U/L (ref 6–29)
AST: 14 U/L (ref 10–35)
Alkaline phosphatase (APISO): 75 U/L (ref 33–130)
BILIRUBIN TOTAL: 1.7 mg/dL — AB (ref 0.2–1.2)
BUN: 9 mg/dL (ref 7–25)
CALCIUM: 9.5 mg/dL (ref 8.6–10.4)
CHLORIDE: 105 mmol/L (ref 98–110)
CO2: 31 mmol/L (ref 20–32)
Creat: 0.85 mg/dL (ref 0.60–0.93)
GFR, EST AFRICAN AMERICAN: 76 mL/min/{1.73_m2} (ref >=60)
GFR, EST NON AFRICAN AMERICAN: 66 mL/min/{1.73_m2} (ref >=60)
Globulin: 3.2 g/dL (calc) (ref 1.9–3.7)
Glucose, Bld: 100 mg/dL (ref 65–139)
POTASSIUM: 3.9 mmol/L (ref 3.5–5.3)
Sodium: 142 mmol/L (ref 135–146)
TOTAL PROTEIN: 7.4 g/dL (ref 6.1–8.1)

## 2017-03-31 LAB — TSH: TSH: 1.53 mIU/L (ref 0.40–4.50)

## 2017-03-31 LAB — VITAMIN D 25 HYDROXY (VIT D DEFICIENCY, FRACTURES): VIT D 25 HYDROXY: 21 ng/mL — AB (ref 30–100)

## 2017-04-01 ENCOUNTER — Telehealth: Payer: Self-pay

## 2017-04-01 DIAGNOSIS — I10 Essential (primary) hypertension: Secondary | ICD-10-CM

## 2017-04-01 DIAGNOSIS — E785 Hyperlipidemia, unspecified: Secondary | ICD-10-CM

## 2017-04-01 NOTE — Telephone Encounter (Signed)
-----   Message from Fayrene Helper, MD sent at 03/31/2017  4:43 AM EST ----- Your labs are excellent. You need to start taking OTC vitamin D 3 2000 I U one daily. Please get fasting labs on 08/18 or shortly after, CBC and lipid , lab order is mailed to you

## 2017-04-03 ENCOUNTER — Encounter: Payer: Self-pay | Admitting: Family Medicine

## 2017-04-03 NOTE — Assessment & Plan Note (Signed)
Improved Patient re-educated about  the importance of commitment to a  minimum of 150 minutes of exercise per week.  The importance of healthy food choices with portion control discussed. Encouraged to start a food diary, count calories and to consider  joining a support group. Sample diet sheets offered. Goals set by the patient for the next several months.   Weight /BMI 03/29/2017 09/14/2016 03/01/2016  WEIGHT 189 lb 198 lb 202 lb 1.3 oz  HEIGHT 5\' 3"  5\' 3"  5\' 3"   BMI 33.48 kg/m2 35.07 kg/m2 35.8 kg/m2

## 2017-04-03 NOTE — Assessment & Plan Note (Signed)
Hyperlipidemia:Low fat diet discussed and encouraged.   Lipid Panel  Lab Results  Component Value Date   CHOL 177 09/08/2016   HDL 61 09/08/2016   LDLCALC 97 09/08/2016   TRIG 94 09/08/2016   CHOLHDL 2.9 09/08/2016   Controlled when last checked  Updated lab needed at/ before next visit.

## 2017-04-03 NOTE — Progress Notes (Signed)
   Allison [name]     MRN: [id number]      DOB: 02-Oct-1940   HPI Allison [name] is here for follow up and re-evaluation of chronic medical conditions, medication management and review of any available recent lab and radiology data.  Preventive health is updated, specifically  Cancer screening and Immunization.   She sopped amlodipine independently because of fear, her bP is now high, she denies chest pain, leg swelling or dyspnea She has modified her diet, started exercise and is losing weight well    ROS Denies recent fever or chills. Denies sinus pressure, nasal congestion, ear pain or sore throat. Denies chest congestion, productive cough or wheezing. Denies chest pains, palpitations and leg swelling Denies abdominal pain, nausea, vomiting,diarrhea or constipation.   Denies dysuria, frequency, hesitancy or incontinence. Denies joint pain, swelling and limitation in mobility. Denies headaches, seizures, numbness, or tingling. Denies depression, anxiety or insomnia. Denies skin break down or rash.   PE  BP (!) 162/90   Pulse 79   Resp 16   Ht 5\' 3"  (1.6 m)   Wt 189 lb (85.7 kg)   SpO2 97%   BMI 33.48 kg/m   Patient alert and oriented and in no cardiopulmonary distress.  HEENT: No facial asymmetry, EOMI,   oropharynx pink and moist.  Neck supple no JVD, no mass.  Chest: Clear to auscultation bilaterally.  CVS: S1, S2 no murmurs, no S3.Regular rate.  ABD: Soft non tender.   Ext: No edema  MS: Adequate though reduced  ROM spine, shoulders, hips and knees.  Skin: Intact, no ulcerations or rash noted.  Psych: Good eye contact, normal affect. Memory intact not anxious or depressed appearing.  CNS: CN 2-12 intact, power,  normal throughout.no focal deficits noted.   Assessment & Plan  HTN (hypertension) Uncontrolled, resume amlodipine, pt re assured of medication safety and advised against independently stopping medication without repacement DASH diet and commitment  to daily physical activity for a minimum of 30 minutes discussed and encouraged, as a part of hypertension management. The importance of attaining a healthy weight is also discussed.  BP/Weight 03/29/2017 09/14/2016 03/01/2016 01/08/2016 07/31/2015 05/20/2015 7/56/4332  Systolic BP 951 884 166 063 016 010 932  Diastolic BP 90 80 78 82 80 78 64  Wt. (Lbs) 189 198 202.08 201.12 200 198 198  BMI 33.48 35.07 35.8 35.63 35.97 35.08 35.08   Nurse BP check in 3 to 4 weeks    Hyperlipidemia LDL goal <100 Hyperlipidemia:Low fat diet discussed and encouraged.   Lipid Panel  Lab Results  Component Value Date   CHOL 177 09/08/2016   HDL 61 09/08/2016   LDLCALC 97 09/08/2016   TRIG 94 09/08/2016   CHOLHDL 2.9 09/08/2016   Controlled when last checked  Updated lab needed at/ before next visit.    Obesity Improved Patient re-educated about  the importance of commitment to a  minimum of 150 minutes of exercise per week.  The importance of healthy food choices with portion control discussed. Encouraged to start a food diary, count calories and to consider  joining a support group. Sample diet sheets offered. Goals set by the patient for the next several months.   Weight /BMI 03/29/2017 09/14/2016 03/01/2016  WEIGHT 189 lb 198 lb 202 lb 1.3 oz  HEIGHT 5\' 3"  5\' 3"  5\' 3"   BMI 33.48 kg/m2 35.07 kg/m2 35.8 kg/m2

## 2017-04-03 NOTE — Assessment & Plan Note (Signed)
Uncontrolled, resume amlodipine, pt re assured of medication safety and advised against independently stopping medication without repacement DASH diet and commitment to daily physical activity for a minimum of 30 minutes discussed and encouraged, as a part of hypertension management. The importance of attaining a healthy weight is also discussed.  BP/Weight 03/29/2017 09/14/2016 03/01/2016 01/08/2016 07/31/2015 05/20/2015 9/48/5462  Systolic BP 703 500 938 182 993 716 967  Diastolic BP 90 80 78 82 80 78 64  Wt. (Lbs) 189 198 202.08 201.12 200 198 198  BMI 33.48 35.07 35.8 35.63 35.97 35.08 35.08   Nurse BP check in 3 to 4 weeks

## 2017-04-03 NOTE — Assessment & Plan Note (Addendum)
Needs supplemental vitamin D OTC

## 2017-04-11 ENCOUNTER — Encounter (HOSPITAL_COMMUNITY): Payer: Self-pay

## 2017-04-11 ENCOUNTER — Ambulatory Visit (INDEPENDENT_AMBULATORY_CARE_PROVIDER_SITE_OTHER): Payer: Medicare Other

## 2017-04-11 ENCOUNTER — Ambulatory Visit (HOSPITAL_COMMUNITY)
Admission: RE | Admit: 2017-04-11 | Discharge: 2017-04-11 | Disposition: A | Discharge status: Home / Self Care | Payer: Medicare Other | Source: Ambulatory Visit | Attending: Family Medicine | Admitting: Family Medicine

## 2017-04-11 ENCOUNTER — Other Ambulatory Visit: Payer: Self-pay | Admitting: Family Medicine

## 2017-04-11 VITALS — BP 142/84 | HR 74 | Temp 98.2°F | Resp 16 | Ht 62.0 in | Wt 190.0 lb

## 2017-04-11 DIAGNOSIS — R928 Other abnormal and inconclusive findings on diagnostic imaging of breast: Secondary | ICD-10-CM

## 2017-04-11 DIAGNOSIS — Z1231 Encounter for screening mammogram for malignant neoplasm of breast: Secondary | ICD-10-CM | POA: Diagnosis not present

## 2017-04-11 DIAGNOSIS — Z0000 Encounter for general adult medical examination without abnormal findings: Secondary | ICD-10-CM
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
## 2017-04-11 NOTE — Patient Instructions (Signed)
[name] , Thank you for taking time to come for your Medicare Wellness Visit. I appreciate your ongoing commitment to your health goals. Please review the following plan we discussed and let me know if I can assist you in the future.   Screening recommendations/referrals: Colonoscopy: Discuss your need for this with Dr. Moshe Cipro Mammogram: 2020 Bone Density: Done Recommended yearly ophthalmology/optometry visit for glaucoma screening and checkup Recommended yearly dental visit for hygiene and checkup  Vaccinations: Influenza vaccine: 2019 Pneumococcal vaccine: 2021 Tdap vaccine: 2022 Shingles vaccine: Discuss the need to update to Shingrix with Dr. Moshe Cipro    Advanced directives: Discussed  Conditions/risks identified: Fall  Next appointment: 09/22/17   Preventive Care 79 Years and Older, Female Preventive care refers to lifestyle choices and visits with your health care provider that can promote health and wellness. What does preventive care include?  A yearly physical exam. This is also called an annual well check.  Dental exams once or twice a year.  Routine eye exams. Ask your health care provider how often you should have your eyes checked.  Personal lifestyle choices, including:  Daily care of your teeth and gums.  Regular physical activity.  Eating a healthy diet.  Avoiding tobacco and drug use.  Limiting alcohol use.  Practicing safe sex.  Taking low-dose aspirin every day.  Taking vitamin and mineral supplements as recommended by your health care provider. What happens during an annual well check? The services and screenings done by your health care provider during your annual well check will depend on your age, overall health, lifestyle risk factors, and family history of disease. Counseling  Your health care provider may ask you questions about your:  Alcohol use.  Tobacco use.  Drug use.  Emotional well-being.  Home and relationship  well-being.  Sexual activity.  Eating habits.  History of falls.  Memory and ability to understand (cognition).  Work and work Statistician.  Reproductive health. Screening  You may have the following tests or measurements:  Height, weight, and BMI.  Blood pressure.  Lipid and cholesterol levels. These may be checked every 5 years, or more frequently if you are over 20 years old.  Skin check.  Lung cancer screening. You may have this screening every year starting at age 64 if you have a 30-pack-year history of smoking and currently smoke or have quit within the past 15 years.  Fecal occult blood test (FOBT) of the stool. You may have this test every year starting at age 51.  Flexible sigmoidoscopy or colonoscopy. You may have a sigmoidoscopy every 5 years or a colonoscopy every 10 years starting at age 38.  Hepatitis C blood test.  Hepatitis B blood test.  Sexually transmitted disease (STD) testing.  Diabetes screening. This is done by checking your blood sugar (glucose) after you have not eaten for a while (fasting). You may have this done every 1-3 years.  Bone density scan. This is done to screen for osteoporosis. You may have this done starting at age 69.  Mammogram. This may be done every 1-2 years. Talk to your health care provider about how often you should have regular mammograms. Talk with your health care provider about your test results, treatment options, and if necessary, the need for more tests. Vaccines  Your health care provider may recommend certain vaccines, such as:  Influenza vaccine. This is recommended every year.  Tetanus, diphtheria, and acellular pertussis (Tdap, Td) vaccine. You may need a Td booster every 10 years.  Zoster vaccine. You may need this after age 56.  Pneumococcal 13-valent conjugate (PCV13) vaccine. One dose is recommended after age 76.  Pneumococcal polysaccharide (PPSV23) vaccine. One dose is recommended after age  43. Talk to your health care provider about which screenings and vaccines you need and how often you need them. This information is not intended to replace advice given to you by your health care provider. Make sure you discuss any questions you have with your health care provider. Document Released: 02/07/2015 Document Revised: 10/01/2015 Document Reviewed: 11/12/2014 Elsevier Interactive Patient Education  2017 [employer] Prevention in the Home Falls can cause injuries. They can happen to people of all ages. There are many things you can do to make your home safe and to help prevent falls. What can I do on the outside of my home?  Regularly fix the edges of walkways and driveways and fix any cracks.  Remove anything that might make you trip as you walk through a door, such as a raised step or threshold.  Trim any bushes or trees on the path to your home.  Use bright outdoor lighting.  Clear any walking paths of anything that might make someone trip, such as rocks or tools.  Regularly check to see if handrails are loose or broken. Make sure that both sides of any steps have handrails.  Any raised decks and porches should have guardrails on the edges.  Have any leaves, snow, or ice cleared regularly.  Use sand or salt on walking paths during winter.  Clean up any spills in your garage right away. This includes oil or grease spills. What can I do in the bathroom?  Use night lights.  Install grab bars by the toilet and in the tub and shower. Do not use towel bars as grab bars.  Use non-skid mats or decals in the tub or shower.  If you need to sit down in the shower, use a plastic, non-slip stool.  Keep the floor dry. Clean up any water that spills on the floor as soon as it happens.  Remove soap buildup in the tub or shower regularly.  Attach bath mats securely with double-sided non-slip rug tape.  Do not have throw rugs and other things on the floor that can make  you trip. What can I do in the bedroom?  Use night lights.  Make sure that you have a light by your bed that is easy to reach.  Do not use any sheets or blankets that are too big for your bed. They should not hang down onto the floor.  Have a firm chair that has side arms. You can use this for support while you get dressed.  Do not have throw rugs and other things on the floor that can make you trip. What can I do in the kitchen?  Clean up any spills right away.  Avoid walking on wet floors.  Keep items that you use a lot in easy-to-reach places.  If you need to reach something above you, use a strong step stool that has a grab bar.  Keep electrical cords out of the way.  Do not use floor polish or wax that makes floors slippery. If you must use wax, use non-skid floor wax.  Do not have throw rugs and other things on the floor that can make you trip. What can I do with my stairs?  Do not leave any items on the stairs.  Make sure that there are  handrails on both sides of the stairs and use them. Fix handrails that are broken or loose. Make sure that handrails are as long as the stairways.  Check any carpeting to make sure that it is firmly attached to the stairs. Fix any carpet that is loose or worn.  Avoid having throw rugs at the top or bottom of the stairs. If you do have throw rugs, attach them to the floor with carpet tape.  Make sure that you have a light switch at the top of the stairs and the bottom of the stairs. If you do not have them, ask someone to add them for you. What else can I do to help prevent falls?  Wear shoes that:  Do not have high heels.  Have rubber bottoms.  Are comfortable and fit you well.  Are closed at the toe. Do not wear sandals.  If you use a stepladder:  Make sure that it is fully opened. Do not climb a closed stepladder.  Make sure that both sides of the stepladder are locked into place.  Ask someone to hold it for you, if  possible.  Clearly mark and make sure that you can see:  Any grab bars or handrails.  First and last steps.  Where the edge of each step is.  Use tools that help you move around (mobility aids) if they are needed. These include:  Canes.  Walkers.  Scooters.  Crutches.  Turn on the lights when you go into a dark area. Replace any light bulbs as soon as they burn out.  Set up your furniture so you have a clear path. Avoid moving your furniture around.  If any of your floors are uneven, fix them.  If there are any pets around you, be aware of where they are.  Review your medicines with your doctor. Some medicines can make you feel dizzy. This can increase your chance of falling. Ask your doctor what other things that you can do to help prevent falls. This information is not intended to replace advice given to you by your health care provider. Make sure you discuss any questions you have with your health care provider. Document Released: 11/07/2008 Document Revised: 06/19/2015 Document Reviewed: 02/15/2014 Elsevier Interactive Patient Education  2017 [employer].

## 2017-04-19 ENCOUNTER — Ambulatory Visit (HOSPITAL_COMMUNITY)
Admission: RE | Admit: 2017-04-19 | Discharge: 2017-04-19 | Disposition: A | Discharge status: Home / Self Care | Payer: Medicare Other | Source: Ambulatory Visit | Attending: Family Medicine | Admitting: Family Medicine

## 2017-04-19 ENCOUNTER — Encounter (HOSPITAL_COMMUNITY): Payer: Self-pay

## 2017-04-19 DIAGNOSIS — R928 Other abnormal and inconclusive findings on diagnostic imaging of breast: Secondary | ICD-10-CM | POA: Diagnosis not present

## 2017-04-20 ENCOUNTER — Other Ambulatory Visit: Payer: Self-pay | Admitting: Family Medicine

## 2017-07-15 ENCOUNTER — Other Ambulatory Visit: Payer: Self-pay | Admitting: Family Medicine

## 2017-09-22 ENCOUNTER — Telehealth: Payer: Self-pay | Admitting: Family Medicine

## 2017-09-22 ENCOUNTER — Encounter: Payer: Self-pay | Admitting: Family Medicine

## 2017-09-22 DIAGNOSIS — I10 Essential (primary) hypertension: Secondary | ICD-10-CM

## 2017-09-22 DIAGNOSIS — E785 Hyperlipidemia, unspecified: Secondary | ICD-10-CM

## 2017-09-22 NOTE — Telephone Encounter (Signed)
Patient is requesting her labs to be reordered they will be expired by the time she comes to get them done.

## 2017-09-22 NOTE — Telephone Encounter (Signed)
Called to re-schedule no show appt today.  Voice mail was full and could not leave message.  I mailed a notice for her to call us and schedule appointment.

## 2017-09-23 NOTE — Telephone Encounter (Signed)
She needs fasting lipid, cmp and EGFR and CBc ordered please

## 2017-10-18 ENCOUNTER — Other Ambulatory Visit: Payer: Self-pay | Admitting: Family Medicine

## 2017-10-19 DIAGNOSIS — I10 Essential (primary) hypertension: Secondary | ICD-10-CM | POA: Diagnosis not present

## 2017-10-19 DIAGNOSIS — E785 Hyperlipidemia, unspecified: Secondary | ICD-10-CM | POA: Diagnosis not present

## 2017-10-19 LAB — CBC
HCT: 40.6 % (ref 35.0–45.0)
HEMOGLOBIN: 13.9 g/dL (ref 11.7–15.5)
MCH: 29.6 pg (ref 27.0–33.0)
MCHC: 34.2 g/dL (ref 32.0–36.0)
MCV: 86.6 fL (ref 80.0–100.0)
MPV: 10.8 fL (ref 7.5–12.5)
Platelets: 377 10*3/uL (ref 140–400)
RBC: 4.69 10*6/uL (ref 3.80–5.10)
RDW: 12.7 % (ref 11.0–15.0)
WBC: 6.8 10*3/uL (ref 3.8–10.8)

## 2017-10-19 LAB — LIPID PANEL
Cholesterol: 201 mg/dL — ABNORMAL HIGH (ref <200)
HDL: 64 mg/dL (ref >50)
LDL CHOLESTEROL (CALC): 116 mg/dL — AB
NON-HDL CHOLESTEROL (CALC): 137 mg/dL — AB (ref <130)
TRIGLYCERIDES: 99 mg/dL (ref <150)
Total CHOL/HDL Ratio: 3.1 (calc) (ref <5.0)

## 2017-10-19 LAB — COMPLETE METABOLIC PANEL WITH GFR
AG Ratio: 1.4 (calc) (ref 1.0–2.5)
ALBUMIN MSPROF: 4.3 g/dL (ref 3.6–5.1)
ALT: 11 U/L (ref 6–29)
AST: 13 U/L (ref 10–35)
Alkaline phosphatase (APISO): 76 U/L (ref 33–130)
BILIRUBIN TOTAL: 1.4 mg/dL — AB (ref 0.2–1.2)
BUN: 11 mg/dL (ref 7–25)
CO2: 30 mmol/L (ref 20–32)
CREATININE: 0.84 mg/dL (ref 0.60–0.93)
Calcium: 9.5 mg/dL (ref 8.6–10.4)
Chloride: 106 mmol/L (ref 98–110)
GFR, EST AFRICAN AMERICAN: 77 mL/min/{1.73_m2} (ref >=60)
GFR, EST NON AFRICAN AMERICAN: 67 mL/min/{1.73_m2} (ref >=60)
GLOBULIN: 3.1 g/dL (ref 1.9–3.7)
Glucose, Bld: 97 mg/dL (ref 65–99)
Potassium: 4.2 mmol/L (ref 3.5–5.3)
Sodium: 141 mmol/L (ref 135–146)
Total Protein: 7.4 g/dL (ref 6.1–8.1)

## 2017-10-27 ENCOUNTER — Ambulatory Visit (INDEPENDENT_AMBULATORY_CARE_PROVIDER_SITE_OTHER): Payer: Medicare Other | Admitting: Family Medicine

## 2017-10-27 ENCOUNTER — Other Ambulatory Visit: Payer: Self-pay

## 2017-10-27 ENCOUNTER — Encounter: Payer: Self-pay | Admitting: Family Medicine

## 2017-10-27 VITALS — BP 134/82 | HR 68 | Resp 12 | Ht 63.0 in | Wt 187.1 lb

## 2017-10-27 DIAGNOSIS — Z0000 Encounter for general adult medical examination without abnormal findings: Secondary | ICD-10-CM | POA: Diagnosis not present

## 2017-10-27 DIAGNOSIS — Z1211 Encounter for screening for malignant neoplasm of colon: Secondary | ICD-10-CM

## 2017-10-27 DIAGNOSIS — Z23 Encounter for immunization: Secondary | ICD-10-CM

## 2017-10-27 DIAGNOSIS — E785 Hyperlipidemia, unspecified: Secondary | ICD-10-CM

## 2017-10-27 DIAGNOSIS — I10 Essential (primary) hypertension: Secondary | ICD-10-CM

## 2017-10-27 DIAGNOSIS — E66811 Obesity, class 1: Secondary | ICD-10-CM

## 2017-10-27 DIAGNOSIS — E559 Vitamin D deficiency, unspecified: Secondary | ICD-10-CM

## 2017-10-27 DIAGNOSIS — Z6833 Body mass index (BMI) 33.0-33.9, adult: Secondary | ICD-10-CM

## 2017-10-27 DIAGNOSIS — E6609 Other obesity due to excess calories: Secondary | ICD-10-CM

## 2017-10-27 NOTE — Progress Notes (Signed)
    Allison [name]     MRN: [id number]      DOB: [birth year]  HPI: Patient is in for annual physical exam. C/o need to  Use laxative on regular basis Recent labs, if available are reviewed. Immunization is reviewed , and  updated if needed.   PE: BP 134/82 (BP Location: Right Arm, Patient Position: Sitting, Cuff Size: Large)   Pulse 68   Resp 12   Ht 5\' 3"  (1.6 m)   Wt 187 lb 1.9 oz (84.9 kg)   SpO2 98%   BMI 33.15 kg/m   Pleasant  female, alert and oriented x 3, in no cardio-pulmonary distress. Afebrile. HEENT No facial trauma or asymetry. Sinuses non tender.  Extra occullar muscles intact, pupils equally reactive to light. External ears normal, tympanic membranes clear. Oropharynx moist, no exudate.Poor dentition Neck: supple, no adenopathy,JVD or thyromegaly.No bruits.  Chest: Clear to ascultation bilaterally.No crackles or wheezes. Non tender to palpation  Breast: No asymetry,no masses or lumps. No tenderness. No nipple discharge or inversion. No axillary or supraclavicular adenopathy  Cardiovascular system; Heart sounds normal,  S1 and  S2 ,no S3.  No murmur, or thrill. Apical beat not displaced Peripheral pulses normal.  Abdomen: Soft, non tender, no organomegaly or masses. No bruits. Bowel sounds normal. No guarding, tenderness or rebound.   Musculoskeletal exam: Decreased though adequate  ROM of spine, hips , shoulders and knees. No deformity ,swelling or crepitus noted. No muscle wasting or atrophy.   Neurologic: Cranial nerves 2 to 12 intact. Power, tone ,sensation and reflexes normal throughout. No disturbance in gait. No tremor.  Skin: Intact, no ulceration, erythema , scaling or rash noted. Pigmentation normal throughout  Psych; Normal mood and affect. Judgement and concentration normal   Assessment & Plan:  Annual physical exam Annual exam as documented. Counseling done  re healthy lifestyle involving commitment to 150 minutes  exercise per week, heart healthy diet, and attaining healthy weight.The importance of adequate sleep also discussed.  Changes in health habits are decided on by the patient with goals and time frames  set for achieving them. Immunization and cancer screening needs are specifically addressed at this visit.   Need for immunization against influenza After obtaining informed consent, the vaccine is  administered by LPN.   Obesity Slight improvement. Patient re-educated about  the importance of commitment to a  minimum of 150 minutes of exercise per week.  The importance of healthy food choices with portion control discussed. Encouraged to start a food diary, count calories and to consider  joining a support group. Sample diet sheets offered. Goals set by the patient for the next several months.   Weight /BMI 10/27/2017 04/11/2017 03/29/2017  WEIGHT 187 lb 1.9 oz 190 lb 189 lb  HEIGHT 5\' 3"  5\' 2"  5\' 3"   BMI 33.15 kg/m2 34.75 kg/m2 33.48 kg/m2

## 2017-10-27 NOTE — Patient Instructions (Addendum)
Wellness with nurse  March 19 or after,   F/U in 6 months with fasting lipid, cmp and EGFr , TSH and Vit D in 5 mon th, 3 weeks  Flu vaccine today  MD follow up in 6 months  Use metamucil ( psylium) every day to help with bowel movements and weight management  It is important that you exercise regularly at least 30 minutes 5 times a week. If you develop chest pain, have severe difficulty breathing, or feel very tired, stop exercising immediately and seek medical attention    Thank you  for choosing [facility]. We consider it a privelige to serve you.  Delivering excellent health care in a caring and  compassionate way is our goal.  Partnering with you,  so that together we can achieve this goal is our strategy.

## 2017-10-30 ENCOUNTER — Encounter: Payer: Self-pay | Admitting: Family Medicine

## 2017-10-30 DIAGNOSIS — Z23 Encounter for immunization: Secondary | ICD-10-CM

## 2017-10-30 HISTORY — DX: Encounter for immunization: Z23

## 2017-10-30 NOTE — Assessment & Plan Note (Signed)
Slight improvement. Patient re-educated about  the importance of commitment to a  minimum of 150 minutes of exercise per week.  The importance of healthy food choices with portion control discussed. Encouraged to start a food diary, count calories and to consider  joining a support group. Sample diet sheets offered. Goals set by the patient for the next several months.   Weight /BMI 10/27/2017 04/11/2017 03/29/2017  WEIGHT 187 lb 1.9 oz 190 lb 189 lb  HEIGHT 5\' 3"  5\' 2"  5\' 3"   BMI 33.15 kg/m2 34.75 kg/m2 33.48 kg/m2

## 2017-10-30 NOTE — Assessment & Plan Note (Signed)
After obtaining informed consent, the vaccine is  administered by LPN.  

## 2017-10-30 NOTE — Assessment & Plan Note (Signed)
Annual exam as documented. Counseling done  re healthy lifestyle involving commitment to 150 minutes exercise per week, heart healthy diet, and attaining healthy weight.The importance of adequate sleep also discussed. Changes in health habits are decided on by the patient with goals and time frames  set for achieving them. Immunization and cancer screening needs are specifically addressed at this visit. 

## 2017-11-03 LAB — COLOGUARD: Cologuard: NEGATIVE

## 2017-11-10 DIAGNOSIS — Z1211 Encounter for screening for malignant neoplasm of colon: Secondary | ICD-10-CM | POA: Diagnosis not present

## 2017-11-11 LAB — COLOGUARD: Cologuard: NEGATIVE

## 2017-11-29 DIAGNOSIS — Z961 Presence of intraocular lens: Secondary | ICD-10-CM | POA: Diagnosis not present

## 2017-11-29 DIAGNOSIS — H52203 Unspecified astigmatism, bilateral: Secondary | ICD-10-CM | POA: Diagnosis not present

## 2017-11-29 DIAGNOSIS — H524 Presbyopia: Secondary | ICD-10-CM | POA: Diagnosis not present

## 2017-12-02 ENCOUNTER — Telehealth: Payer: Self-pay

## 2017-12-02 NOTE — Telephone Encounter (Signed)
Patient advised of cologuard results (negative)

## 2018-01-20 ENCOUNTER — Other Ambulatory Visit: Payer: Self-pay | Admitting: Family Medicine

## 2018-04-14 ENCOUNTER — Other Ambulatory Visit: Payer: Self-pay

## 2018-04-14 ENCOUNTER — Ambulatory Visit (INDEPENDENT_AMBULATORY_CARE_PROVIDER_SITE_OTHER): Payer: Medicare Other | Admitting: Family Medicine

## 2018-04-14 ENCOUNTER — Encounter: Payer: Self-pay | Admitting: Family Medicine

## 2018-04-14 DIAGNOSIS — Z780 Asymptomatic menopausal state: Secondary | ICD-10-CM | POA: Diagnosis not present

## 2018-04-14 DIAGNOSIS — Z0000 Encounter for general adult medical examination without abnormal findings: Secondary | ICD-10-CM | POA: Diagnosis not present
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
## 2018-04-14 NOTE — Patient Instructions (Signed)
Thank you for coming into the office today. I appreciate the opportunity to provide you with the care for your health and wellness.  You have a follow up with Dr Moshe Cipro next month.  We hopefully will be able to see patients in the office again, by then.  We will let you know if this changes.   Ms. Hallisey , Thank you for taking time to come for your Medicare Wellness Visit. I appreciate your ongoing commitment to your health goals. Please review the following plan we discussed and let me know if I can assist you in the future.    [place] YOUR HANDS WELL AND FREQUENTLY. AVOID TOUCHING YOUR FACE, UNLESS YOUR HANDS ARE FRESHLY WASHED.   GET FRESH AIR DAILY. STAY HYDRATED WITH WATER.    Screening recommendations/referrals: Colonoscopy: Up to Date Mammogram: Ordered Bone Density: Ordered Recommended yearly ophthalmology/optometry visit for glaucoma screening and checkup Recommended yearly dental visit for hygiene and checkup   Preventive Care 31 Years and Older, Female Preventive care refers to lifestyle choices and visits with your health care provider that can promote health and wellness. What does preventive care include?  A yearly physical exam. This is also called an annual well check.  Dental exams once or twice a year.  Routine eye exams. Ask your health care provider how often you should have your eyes checked.  Personal lifestyle choices, including:  Daily care of your teeth and gums.  Regular physical activity.  Eating a healthy diet.  Avoiding tobacco and drug use.  Limiting alcohol use.  Practicing safe sex.  Taking low-dose aspirin every day.  Taking vitamin and mineral supplements as recommended by your health care provider. What happens during an annual well check? The services and screenings done by your health care provider during your annual well check will depend on your age, overall health, lifestyle risk factors, and family history of  disease. Counseling  Your health care provider may ask you questions about your:  Alcohol use.  Tobacco use.  Drug use.  Emotional well-being.  Home and relationship well-being.  Sexual activity.  Eating habits.  History of falls.  Memory and ability to understand (cognition).  Work and work Statistician.  Reproductive health. Screening  You may have the following tests or measurements:  Height, weight, and BMI.  Blood pressure.  Lipid and cholesterol levels. These may be checked every 5 years, or more frequently if you are over 14 years old.  Skin check.  Lung cancer screening. You may have this screening every year starting at age 20 if you have a 30-pack-year history of smoking and currently smoke or have quit within the past 15 years.  Fecal occult blood test (FOBT) of the stool. You may have this test every year starting at age 80.  Flexible sigmoidoscopy or colonoscopy. You may have a sigmoidoscopy every 5 years or a colonoscopy every 10 years starting at age 85.  Hepatitis C blood test.  Hepatitis B blood test.  Sexually transmitted disease (STD) testing.  Diabetes screening. This is done by checking your blood sugar (glucose) after you have not eaten for a while (fasting). You may have this done every 1-3 years.  Bone density scan. This is done to screen for osteoporosis. You may have this done starting at age 27.  Mammogram. This may be done every 1-2 years. Talk to your health care provider about how often you should have regular mammograms. Talk with your health care provider about your test  results, treatment options, and if necessary, the need for more tests. Vaccines  Your health care provider may recommend certain vaccines, such as:  Influenza vaccine. This is recommended every year.  Tetanus, diphtheria, and acellular pertussis (Tdap, Td) vaccine. You may need a Td booster every 10 years.  Zoster vaccine. You may need this after age  61.  Pneumococcal 13-valent conjugate (PCV13) vaccine. One dose is recommended after age 75.  Pneumococcal polysaccharide (PPSV23) vaccine. One dose is recommended after age 28. Talk to your health care provider about which screenings and vaccines you need and how often you need them. This information is not intended to replace advice given to you by your health care provider. Make sure you discuss any questions you have with your health care provider. Document Released: 02/07/2015 Document Revised: 10/01/2015 Document Reviewed: 11/12/2014 Elsevier Interactive Patient Education  2017 [place] Prevention in the Home Falls can cause injuries. They can happen to people of all ages. There are many things you can do to make your home safe and to help prevent falls. What can I do on the outside of my home?  Regularly fix the edges of walkways and driveways and fix any cracks.  Remove anything that might make you trip as you walk through a door, such as a raised step or threshold.  Trim any bushes or trees on the path to your home.  Use bright outdoor lighting.  Clear any walking paths of anything that might make someone trip, such as rocks or tools.  Regularly check to see if handrails are loose or broken. Make sure that both sides of any steps have handrails.  Any raised decks and porches should have guardrails on the edges.  Have any leaves, snow, or ice cleared regularly.  Use sand or salt on walking paths during winter.  Clean up any spills in your garage right away. This includes oil or grease spills. What can I do in the bathroom?  Use night lights.  Install grab bars by the toilet and in the tub and shower. Do not use towel bars as grab bars.  Use non-skid mats or decals in the tub or shower.  If you need to sit down in the shower, use a plastic, non-slip stool.  Keep the floor dry. Clean up any water that spills on the floor as soon as it happens.  Remove  soap buildup in the tub or shower regularly.  Attach bath mats securely with double-sided non-slip rug tape.  Do not have throw rugs and other things on the floor that can make you trip. What can I do in the bedroom?  Use night lights.  Make sure that you have a light by your bed that is easy to reach.  Do not use any sheets or blankets that are too big for your bed. They should not hang down onto the floor.  Have a firm chair that has side arms. You can use this for support while you get dressed.  Do not have throw rugs and other things on the floor that can make you trip. What can I do in the kitchen?  Clean up any spills right away.  Avoid walking on wet floors.  Keep items that you use a lot in easy-to-reach places.  If you need to reach something above you, use a strong step stool that has a grab bar.  Keep electrical cords out of the way.  Do not use floor polish or wax that makes  floors slippery. If you must use wax, use non-skid floor wax.  Do not have throw rugs and other things on the floor that can make you trip. What can I do with my stairs?  Do not leave any items on the stairs.  Make sure that there are handrails on both sides of the stairs and use them. Fix handrails that are broken or loose. Make sure that handrails are as long as the stairways.  Check any carpeting to make sure that it is firmly attached to the stairs. Fix any carpet that is loose or worn.  Avoid having throw rugs at the top or bottom of the stairs. If you do have throw rugs, attach them to the floor with carpet tape.  Make sure that you have a light switch at the top of the stairs and the bottom of the stairs. If you do not have them, ask someone to add them for you. What else can I do to help prevent falls?  Wear shoes that:  Do not have high heels.  Have rubber bottoms.  Are comfortable and fit you well.  Are closed at the toe. Do not wear sandals.  If you use a  stepladder:  Make sure that it is fully opened. Do not climb a closed stepladder.  Make sure that both sides of the stepladder are locked into place.  Ask someone to hold it for you, if possible.  Clearly mark and make sure that you can see:  Any grab bars or handrails.  First and last steps.  Where the edge of each step is.  Use tools that help you move around (mobility aids) if they are needed. These include:  Canes.  Walkers.  Scooters.  Crutches.  Turn on the lights when you go into a dark area. Replace any light bulbs as soon as they burn out.  Set up your furniture so you have a clear path. Avoid moving your furniture around.  If any of your floors are uneven, fix them.  If there are any pets around you, be aware of where they are.  Review your medicines with your doctor. Some medicines can make you feel dizzy. This can increase your chance of falling. Ask your doctor what other things that you can do to help prevent falls. This information is not intended to replace advice given to you by your health care provider. Make sure you discuss any questions you have with your health care provider. Document Released: 11/07/2008 Document Revised: 06/19/2015 Document Reviewed: 02/15/2014 Elsevier Interactive Patient Education  2017 [place] YOUR HANDS WELL AND FREQUENTLY. AVOID TOUCHING YOUR FACE, UNLESS YOUR HANDS ARE FRESHLY WASHED.   GET FRESH AIR DAILY. STAY HYDRATED WITH WATER.   It was a pleasure to see you and I look forward to continuing to work together on your health and well-being. Please do not hesitate to call the office if you need care or have questions about your care.  Have a wonderful day and week.  With Gratitude,  Cherly Beach, DNP, AGNP-BC

## 2018-04-17 ENCOUNTER — Ambulatory Visit: Payer: Self-pay

## 2018-04-24 DIAGNOSIS — E559 Vitamin D deficiency, unspecified: Secondary | ICD-10-CM | POA: Diagnosis not present

## 2018-04-24 DIAGNOSIS — E785 Hyperlipidemia, unspecified: Secondary | ICD-10-CM | POA: Diagnosis not present

## 2018-04-24 DIAGNOSIS — I10 Essential (primary) hypertension: Secondary | ICD-10-CM | POA: Diagnosis not present

## 2018-04-24 DIAGNOSIS — Z0000 Encounter for general adult medical examination without abnormal findings: Secondary | ICD-10-CM | POA: Diagnosis not present

## 2018-04-25 ENCOUNTER — Other Ambulatory Visit: Payer: Self-pay | Admitting: Family Medicine

## 2018-04-25 LAB — COMPLETE METABOLIC PANEL WITH GFR
AG RATIO: 1.4 (calc) (ref 1.0–2.5)
ALBUMIN MSPROF: 4.2 g/dL (ref 3.6–5.1)
ALT: 11 U/L (ref 6–29)
AST: 11 U/L (ref 10–35)
Alkaline phosphatase (APISO): 75 U/L (ref 37–153)
BUN: 12 mg/dL (ref 7–25)
CHLORIDE: 106 mmol/L (ref 98–110)
CO2: 28 mmol/L (ref 20–32)
Calcium: 9.4 mg/dL (ref 8.6–10.4)
Creat: 0.91 mg/dL (ref 0.60–0.93)
GFR, EST AFRICAN AMERICAN: 70 mL/min/{1.73_m2} (ref >=60)
GFR, Est Non African American: 60 mL/min/{1.73_m2} (ref >=60)
GLOBULIN: 3.1 g/dL (ref 1.9–3.7)
Glucose, Bld: 97 mg/dL (ref 65–99)
POTASSIUM: 4 mmol/L (ref 3.5–5.3)
Sodium: 141 mmol/L (ref 135–146)
TOTAL PROTEIN: 7.3 g/dL (ref 6.1–8.1)
Total Bilirubin: 1.8 mg/dL — ABNORMAL HIGH (ref 0.2–1.2)

## 2018-04-25 LAB — LIPID PANEL
Cholesterol: 181 mg/dL (ref <200)
HDL: 68 mg/dL (ref >=50)
LDL Cholesterol (Calc): 97 mg/dL (calc)
Non-HDL Cholesterol (Calc): 113 mg/dL (calc) (ref <130)
Total CHOL/HDL Ratio: 2.7 (calc) (ref <5.0)
Triglycerides: 69 mg/dL (ref <150)

## 2018-04-25 LAB — TSH: TSH: 1.65 mIU/L (ref 0.40–4.50)

## 2018-04-25 LAB — VITAMIN D 25 HYDROXY (VIT D DEFICIENCY, FRACTURES): Vit D, 25-Hydroxy: 23 ng/mL — ABNORMAL LOW (ref 30–100)

## 2018-05-01 ENCOUNTER — Other Ambulatory Visit: Payer: Self-pay

## 2018-05-01 ENCOUNTER — Other Ambulatory Visit: Payer: Self-pay | Admitting: Family Medicine

## 2018-05-01 ENCOUNTER — Encounter: Payer: Self-pay | Admitting: Family Medicine

## 2018-05-01 ENCOUNTER — Ambulatory Visit (INDEPENDENT_AMBULATORY_CARE_PROVIDER_SITE_OTHER): Payer: Medicare Other | Admitting: Family Medicine

## 2018-05-01 VITALS — BP 134/82 | Ht 63.0 in | Wt 187.0 lb

## 2018-05-01 DIAGNOSIS — K5900 Constipation, unspecified: Secondary | ICD-10-CM

## 2018-05-01 DIAGNOSIS — Z1231 Encounter for screening mammogram for malignant neoplasm of breast: Secondary | ICD-10-CM

## 2018-05-01 DIAGNOSIS — E6609 Other obesity due to excess calories: Secondary | ICD-10-CM | POA: Diagnosis not present

## 2018-05-01 DIAGNOSIS — I10 Essential (primary) hypertension: Secondary | ICD-10-CM

## 2018-05-01 DIAGNOSIS — M8580 Other specified disorders of bone density and structure, unspecified site: Secondary | ICD-10-CM | POA: Diagnosis not present

## 2018-05-01 DIAGNOSIS — E785 Hyperlipidemia, unspecified: Secondary | ICD-10-CM

## 2018-05-01 DIAGNOSIS — Z6833 Body mass index (BMI) 33.0-33.9, adult: Secondary | ICD-10-CM

## 2018-05-01 DIAGNOSIS — E66811 Obesity, class 1: Secondary | ICD-10-CM

## 2018-05-01 MED ORDER — PRAVASTATIN SODIUM 20 MG PO TABS: ORAL_TABLET | ORAL | 3 refills | Status: DC

## 2018-05-01 MED ORDER — SENNOSIDES-DOCUSATE SODIUM 8.6-50 MG PO TABS: 1.0000 | ORAL_TABLET | Freq: Two times a day (BID) | ORAL | 5 refills | Status: AC

## 2018-05-01 NOTE — Assessment & Plan Note (Signed)
   Patient re-educated about  the importance of commitment to a  minimum of 150 minutes of exercise per week as able.  The importance of healthy food choices with portion control discussed, as well as eating regularly and within a 12 hour window most days. The need to choose "clean , green" food 50 to 75% of the time is discussed, as well as to make water the primary drink and set a goal of 64 ounces water daily.  Encouraged to start a food diary,  and to consider  joining a support group. Sample diet sheets offered. Goals set by the patient for the next several months.   Weight /BMI 05/01/2018 10/27/2017 04/11/2017  WEIGHT 187 lb 187 lb 1.9 oz 190 lb  HEIGHT 5\' 3"  5\' 3"  5\' 2"   BMI 33.13 kg/m2 33.15 kg/m2 34.75 kg/m2

## 2018-05-01 NOTE — Progress Notes (Signed)
Virtual Visit via Telephone Note  I connected with Allison [name] on 05/01/18 at  9:40 AM EDT by telephone and verified that I am speaking with the correct person using two identifiers.   I discussed the limitations, risks, security and privacy concerns of performing an evaluation and management service by telephone and the availability of in person appointments. I also discussed with the patient that there may be a patient responsible charge related to this service. The patient expressed understanding and agreed to proceed. Pt is in her home and I am in my house, visual contact is not possible though it is desired  History of Present Illness: Denies recent fever or chills. Denies sinus pressure, nasal congestion, ear pain or sore throat. Denies chest congestion, productive cough or wheezing. Denies chest pains, palpitations and leg swelling Denies abdominal pain, nausea, vomiting,diarrhea  Denies dysuria, frequency, hesitancy or incontinence. Denies joint pain, swelling and limitation in mobility. Denies headaches, seizures, numbness, or tingling. Denies depression, anxiety or insomnia. Denies skin break down or rash. C/o constipation and has ahd 1 fall       Observations/Objective: BP 134/82   Ht 5\' 3"  (1.6 m)   Wt 187 lb (84.8 kg)   BMI 33.13 kg/m     Assessment and Plan: Osteopenia Updated test needed at/ before next visit.   HTN (hypertension) Controlled, no change in medication DASH diet and commitment to daily physical activity for a minimum of 30 minutes discussed and encouraged, as a part of hypertension management. The importance of attaining a healthy weight is also discussed.  BP/Weight 05/01/2018 10/27/2017 04/11/2017 03/29/2017 09/14/2016 03/01/2016 51/88/4166  Systolic BP 063 016 010 932 355 732 202  Diastolic BP 82 82 84 90 80 78 82  Wt. (Lbs) 187 187.12 190 189 198 202.08 201.12  BMI 33.13 33.15 34.75 33.48 35.07 35.8 35.63       Hyperlipidemia LDL goal  <100 Controlled, no change in medication Hyperlipidemia:Low fat diet discussed and encouraged.   Lipid Panel  Lab Results  Component Value Date   CHOL 181 04/24/2018   HDL 68 04/24/2018   LDLCALC 97 04/24/2018   TRIG 69 04/24/2018   CHOLHDL 2.7 04/24/2018       Obesity   Patient re-educated about  the importance of commitment to a  minimum of 150 minutes of exercise per week as able.  The importance of healthy food choices with portion control discussed, as well as eating regularly and within a 12 hour window most days. The need to choose "clean , green" food 50 to 75% of the time is discussed, as well as to make water the primary drink and set a goal of 64 ounces water daily.  Encouraged to start a food diary,  and to consider  joining a support group. Sample diet sheets offered. Goals set by the patient for the next several months.   Weight /BMI 05/01/2018 10/27/2017 04/11/2017  WEIGHT 187 lb 187 lb 1.9 oz 190 lb  HEIGHT 5\' 3"  5\' 3"  5\' 2"   BMI 33.13 kg/m2 33.15 kg/m2 34.75 kg/m2      Constipation Diet reviewed with high fiber, and increase walking to 30 minutes every day, and water 64 oz daily. Senokot S prescribed for twice daily use    Follow Up Instructions:    I discussed the assessment and treatment plan with the patient. The patient was provided an opportunity to ask questions and all were answered. The patient agreed with the plan and demonstrated an understanding  of the instructions.   The patient was advised to call back or seek an in-person evaluation if the symptoms worsen or if the condition fails to improve as anticipated.  I provided 24 minutes of non-face-to-face time during this encounter.   Tula Nakayama, MD

## 2018-05-01 NOTE — Patient Instructions (Addendum)
Annual physical exam with MD October 4 or after, call if you need me sooner  Congrats on excellent blood work, no medication changes  Nurse will ensure medication is refilled for an additional 12 months.  You are referred for mammogram and  Bone density tests to be done in the Summer  Please get fasting lipid, cmp and EGFr, CBC and TSH last week in September  Please be careful not to fall  Social distancing. Frequent hand washing with soap and water Keeping your hands off of your face. These 3 practices will help to keep both you and your community healthy during this time. Please practice them faithfully!   Thanks for choosing [facility], we consider it a privelige to serve you.

## 2018-05-01 NOTE — Assessment & Plan Note (Signed)
Controlled, no change in medication Hyperlipidemia:Low fat diet discussed and encouraged.   Lipid Panel  Lab Results  Component Value Date   CHOL 181 04/24/2018   HDL 68 04/24/2018   LDLCALC 97 04/24/2018   TRIG 69 04/24/2018   CHOLHDL 2.7 04/24/2018

## 2018-05-01 NOTE — Assessment & Plan Note (Signed)
Updated test needed at/ before next visit.

## 2018-05-01 NOTE — Addendum Note (Signed)
Addended by: Eual Fines on: 05/01/2018 10:28 AM   Modules accepted: Orders

## 2018-05-01 NOTE — Assessment & Plan Note (Signed)
Diet reviewed with high fiber, and increase walking to 30 minutes every day, and water 64 oz daily. Senokot S prescribed for twice daily use

## 2018-05-01 NOTE — Assessment & Plan Note (Signed)
Controlled, no change in medication DASH diet and commitment to daily physical activity for a minimum of 30 minutes discussed and encouraged, as a part of hypertension management. The importance of attaining a healthy weight is also discussed.  BP/Weight 05/01/2018 10/27/2017 04/11/2017 03/29/2017 09/14/2016 03/01/2016 19/37/9024  Systolic BP 097 353 299 [phone]  Diastolic BP 82 82 84 90 80 78 82  Wt. (Lbs) 187 187.12 190 189 198 202.08 201.12  BMI 33.13 33.15 34.75 33.48 35.07 35.8 35.63

## 2018-06-07 ENCOUNTER — Ambulatory Visit (HOSPITAL_COMMUNITY): Payer: Self-pay

## 2018-06-07 ENCOUNTER — Inpatient Hospital Stay (HOSPITAL_COMMUNITY): Admission: RE | Admit: 2018-06-07 | Payer: Self-pay | Source: Ambulatory Visit

## 2018-07-13 ENCOUNTER — Other Ambulatory Visit (HOSPITAL_COMMUNITY): Payer: Medicare Other

## 2018-07-13 ENCOUNTER — Ambulatory Visit (HOSPITAL_COMMUNITY): Payer: Medicare Other

## 2018-07-28 ENCOUNTER — Other Ambulatory Visit: Payer: Self-pay | Admitting: Family Medicine

## 2018-09-06 ENCOUNTER — Ambulatory Visit (HOSPITAL_COMMUNITY)
Admission: RE | Admit: 2018-09-06 | Discharge: 2018-09-06 | Disposition: A | Discharge status: Home / Self Care | Payer: Medicare Other | Source: Ambulatory Visit | Attending: Family Medicine | Admitting: Family Medicine

## 2018-09-06 ENCOUNTER — Other Ambulatory Visit: Payer: Self-pay

## 2018-09-06 DIAGNOSIS — Z1231 Encounter for screening mammogram for malignant neoplasm of breast: Secondary | ICD-10-CM | POA: Diagnosis not present

## 2018-09-06 DIAGNOSIS — Z780 Asymptomatic menopausal state: Secondary | ICD-10-CM | POA: Diagnosis not present

## 2018-09-06 DIAGNOSIS — M8580 Other specified disorders of bone density and structure, unspecified site: Secondary | ICD-10-CM

## 2018-09-06 DIAGNOSIS — M8588 Other specified disorders of bone density and structure, other site: Secondary | ICD-10-CM | POA: Insufficient documentation

## 2018-09-06 NOTE — Progress Notes (Signed)
No mammographic evidence of malignancy.   F/u 1 year

## 2018-09-18 ENCOUNTER — Telehealth: Payer: Self-pay | Admitting: Family Medicine

## 2018-09-18 NOTE — Telephone Encounter (Signed)
Pt is calling in to discuss letter she received from [facility]

## 2018-09-19 NOTE — Telephone Encounter (Signed)
Spoke with patient and gave her the results of her recent bone density results with verbal understanding.

## 2018-09-19 NOTE — Telephone Encounter (Signed)
Pt is calling back

## 2018-09-25 ENCOUNTER — Ambulatory Visit (INDEPENDENT_AMBULATORY_CARE_PROVIDER_SITE_OTHER): Payer: Medicare Other

## 2018-09-25 ENCOUNTER — Other Ambulatory Visit: Payer: Self-pay

## 2018-09-25 DIAGNOSIS — Z23 Encounter for immunization: Secondary | ICD-10-CM | POA: Diagnosis not present

## 2018-10-30 ENCOUNTER — Ambulatory Visit (INDEPENDENT_AMBULATORY_CARE_PROVIDER_SITE_OTHER): Payer: Medicare Other | Admitting: Family Medicine

## 2018-10-30 ENCOUNTER — Other Ambulatory Visit: Payer: Self-pay

## 2018-10-30 ENCOUNTER — Encounter: Payer: Self-pay | Admitting: Family Medicine

## 2018-10-30 DIAGNOSIS — Z0000 Encounter for general adult medical examination without abnormal findings: Secondary | ICD-10-CM | POA: Diagnosis not present

## 2018-10-30 DIAGNOSIS — E785 Hyperlipidemia, unspecified: Secondary | ICD-10-CM | POA: Diagnosis not present

## 2018-10-30 DIAGNOSIS — I10 Essential (primary) hypertension: Secondary | ICD-10-CM | POA: Diagnosis not present

## 2018-10-30 NOTE — Progress Notes (Signed)
    Allison [name]     MRN: [id number]      DOB: 25-Dec-1940  HPI: Patient is in for annual physical exam. C/o weight gain, and "fullness/ swelling" over area where she had cholecystectomy. Denies any associated pain or nausea Updated labs to be reviewed when available Immunization is Up to date   PE: BP 124/84   Pulse 68   Temp (!) 97.5 F (36.4 C) (Temporal)   Resp 15   Ht 5\' 3"  (1.6 m)   Wt 194 lb (88 kg)   SpO2 96%   BMI 34.37 kg/m   Pleasant  female, alert and oriented x 3, in no cardio-pulmonary distress. Afebrile. HEENT No facial trauma or asymetry. Sinuses non tender.  Extra occullar muscles intact, . External ears normal,  Neck: supple, no adenopathy,JVD or thyromegaly.No bruits.  Chest: Clear to ascultation bilaterally.No crackles or wheezes. Non tender to palpation  Breast: Not examined, normal mammogram in 0and denies mass or painNS sy  Cardiovascular system; Heart sounds normal,  S1 and  S2 ,no S3.  No murmur, or thrill. Apical beat not displaced Peripheral pulses normal.  Abdomen: Soft, non tender, no organomegaly or masses. No bruits. Bowel sounds normal. No guarding, tenderness or rebound.   Musculoskeletal exam: Full ROM of spine, hips , shoulders and knees. No deformity ,swelling or crepitus noted. No muscle wasting or atrophy.   Neurologic: Cranial nerves 2 to 12 intact. Power, tone ,sensation s normal throughout. No disturbance in gait. No tremor.  Skin: Intact, no ulceration, erythema , scaling or rash noted. Pigmentation normal throughout  Psych; Normal mood and affect. Judgement and concentration normal   Assessment & Plan:  Annual physical exam Annual exam as documented. Counseling done  re healthy lifestyle involving commitment to 150 minutes exercise per week, heart healthy diet, and attaining healthy weight.The importance of adequate sleep also discussed. Regular seat belt use and home safety, is also discussed.  Changes in health habits are decided on by the patient with goals and time frames  set for achieving them. Immunization and cancer screening needs are specifically addressed at this visit.

## 2018-10-30 NOTE — Patient Instructions (Addendum)
F/U with MD in May  2021, call if you need me before  Please get labs ordered in April today after you leave .  Blood pressure is excellent, please work on losing 7 to 10 pounds over the next 10 monhts  You will get a 1500 to 1800 calorie diet  Your exam today is good.  It is important that you exercise regularly at least 30 minutes 5 times a week. If you develop chest pain, have severe difficulty breathing, or feel very tired, stop exercising immediately and seek medical attention    Thanks for choosing [facility], we consider it a privelige to serve you.

## 2018-10-30 NOTE — Assessment & Plan Note (Signed)

## 2018-10-31 LAB — CBC
HCT: 40.6 % (ref 35.0–45.0)
Hemoglobin: 13.3 g/dL (ref 11.7–15.5)
MCH: 29.2 pg (ref 27.0–33.0)
MCHC: 32.8 g/dL (ref 32.0–36.0)
MCV: 89.2 fL (ref 80.0–100.0)
MPV: 11.3 fL (ref 7.5–12.5)
Platelets: 395 10*3/uL (ref 140–400)
RBC: 4.55 10*6/uL (ref 3.80–5.10)
RDW: 12.5 % (ref 11.0–15.0)
WBC: 7.1 10*3/uL (ref 3.8–10.8)

## 2018-10-31 LAB — LIPID PANEL
Cholesterol: 176 mg/dL (ref <200)
HDL: 65 mg/dL (ref >=50)
LDL Cholesterol (Calc): 93 mg/dL (calc)
Non-HDL Cholesterol (Calc): 111 mg/dL (calc) (ref <130)
Total CHOL/HDL Ratio: 2.7 (calc) (ref <5.0)
Triglycerides: 87 mg/dL (ref <150)

## 2018-10-31 LAB — COMPLETE METABOLIC PANEL WITH GFR
AG Ratio: 1.3 (calc) (ref 1.0–2.5)
ALT: 10 U/L (ref 6–29)
AST: 14 U/L (ref 10–35)
Albumin: 4.3 g/dL (ref 3.6–5.1)
Alkaline phosphatase (APISO): 68 U/L (ref 37–153)
BUN: 13 mg/dL (ref 7–25)
CO2: 28 mmol/L (ref 20–32)
Calcium: 9.3 mg/dL (ref 8.6–10.4)
Chloride: 106 mmol/L (ref 98–110)
Creat: 0.89 mg/dL (ref 0.60–0.93)
GFR, Est African American: 71 mL/min/{1.73_m2} (ref >=60)
GFR, Est Non African American: 62 mL/min/{1.73_m2} (ref >=60)
Globulin: 3.3 g/dL (calc) (ref 1.9–3.7)
Glucose, Bld: 88 mg/dL (ref 65–99)
Potassium: 4.3 mmol/L (ref 3.5–5.3)
Sodium: 142 mmol/L (ref 135–146)
Total Bilirubin: 1.3 mg/dL — ABNORMAL HIGH (ref 0.2–1.2)
Total Protein: 7.6 g/dL (ref 6.1–8.1)

## 2018-10-31 LAB — TSH: TSH: 1.59 mIU/L (ref 0.40–4.50)

## 2018-11-01 ENCOUNTER — Encounter: Payer: Self-pay | Admitting: *Deleted

## 2018-12-04 DIAGNOSIS — H524 Presbyopia: Secondary | ICD-10-CM | POA: Diagnosis not present

## 2018-12-04 DIAGNOSIS — H52203 Unspecified astigmatism, bilateral: Secondary | ICD-10-CM | POA: Diagnosis not present

## 2018-12-04 DIAGNOSIS — Z961 Presence of intraocular lens: Secondary | ICD-10-CM | POA: Diagnosis not present

## 2019-01-31 ENCOUNTER — Other Ambulatory Visit: Payer: Self-pay | Admitting: Family Medicine

## 2019-02-09 ENCOUNTER — Other Ambulatory Visit: Payer: Self-pay | Admitting: Family Medicine

## 2019-04-16 ENCOUNTER — Ambulatory Visit: Payer: Self-pay

## 2019-04-16 ENCOUNTER — Other Ambulatory Visit: Payer: Self-pay

## 2019-04-16 ENCOUNTER — Ambulatory Visit (INDEPENDENT_AMBULATORY_CARE_PROVIDER_SITE_OTHER): Payer: Medicare Other

## 2019-04-16 VITALS — BP 124/84 | Ht 63.0 in | Wt 194.0 lb

## 2019-04-16 DIAGNOSIS — Z0000 Encounter for general adult medical examination without abnormal findings: Secondary | ICD-10-CM
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
## 2019-04-16 NOTE — Patient Instructions (Signed)
Allison [name] , Thank you for taking time to come for your Medicare Wellness Visit. I appreciate your ongoing commitment to your health goals. Please review the following plan we discussed and let me know if I can assist you in the future.   Screening recommendations/referrals: Colonoscopy: due 2021- let us know if you would like [place] to order this Mammogram: up to date  Bone Density: completed Recommended yearly ophthalmology/optometry visit for glaucoma screening and checkup Recommended yearly dental visit for hygiene and checkup  Vaccinations: Influenza vaccine: completed Pneumococcal vaccine: up to date  Tdap vaccine: up to date  Shingles vaccine: can check with pharmacy    Advanced directives: packet mailed   Conditions/risks identified: fall risk.   Next appointment: 05/28/2019   Preventive Care 65 Years and Older, Female Preventive care refers to lifestyle choices and visits with your health care provider that can promote health and wellness. What does preventive care include?  A yearly physical exam. This is also called an annual well check.  Dental exams once or twice a year.  Routine eye exams. Ask your health care provider how often you should have your eyes checked.  Personal lifestyle choices, including:  Daily care of your teeth and gums.  Regular physical activity.  Eating a healthy diet.  Avoiding tobacco and drug use.  Limiting alcohol use.  Practicing safe sex.  Taking low-dose aspirin every day.  Taking vitamin and mineral supplements as recommended by your health care provider. What happens during an annual well check? The services and screenings done by your health care provider during your annual well check will depend on your age, overall health, lifestyle risk factors, and family history of disease. Counseling  Your health care provider may ask you questions about your:  Alcohol use.  Tobacco use.  Drug use.  Emotional  well-being.  Home and relationship well-being.  Sexual activity.  Eating habits.  History of falls.  Memory and ability to understand (cognition).  Work and work Statistician.  Reproductive health. Screening  You may have the following tests or measurements:  Height, weight, and BMI.  Blood pressure.  Lipid and cholesterol levels. These may be checked every 5 years, or more frequently if you are over 40 years old.  Skin check.  Lung cancer screening. You may have this screening every year starting at age 21 if you have a 30-pack-year history of smoking and currently smoke or have quit within the past 15 years.  Fecal occult blood test (FOBT) of the stool. You may have this test every year starting at age 99.  Flexible sigmoidoscopy or colonoscopy. You may have a sigmoidoscopy every 5 years or a colonoscopy every 10 years starting at age 34.  Hepatitis C blood test.  Hepatitis B blood test.  Sexually transmitted disease (STD) testing.  Diabetes screening. This is done by checking your blood sugar (glucose) after you have not eaten for a while (fasting). You may have this done every 1-3 years.  Bone density scan. This is done to screen for osteoporosis. You may have this done starting at age 35.  Mammogram. This may be done every 1-2 years. Talk to your health care provider about how often you should have regular mammograms. Talk with your health care provider about your test results, treatment options, and if necessary, the need for more tests. Vaccines  Your health care provider may recommend certain vaccines, such as:  Influenza vaccine. This is recommended every year.  Tetanus, diphtheria, and acellular pertussis (Tdap,  Td) vaccine. You may need a Td booster every 10 years.  Zoster vaccine. You may need this after age 20.  Pneumococcal 13-valent conjugate (PCV13) vaccine. One dose is recommended after age 70.  Pneumococcal polysaccharide (PPSV23) vaccine. One  dose is recommended after age 52. Talk to your health care provider about which screenings and vaccines you need and how often you need them. This information is not intended to replace advice given to you by your health care provider. Make sure you discuss any questions you have with your health care provider. Document Released: 02/07/2015 Document Revised: 10/01/2015 Document Reviewed: 11/12/2014 Elsevier Interactive Patient Education  2017 [employer] Prevention in the Home Falls can cause injuries. They can happen to people of all ages. There are many things you can do to make your home safe and to help prevent falls. What can I do on the outside of my home?  Regularly fix the edges of walkways and driveways and fix any cracks.  Remove anything that might make you trip as you walk through a door, such as a raised step or threshold.  Trim any bushes or trees on the path to your home.  Use bright outdoor lighting.  Clear any walking paths of anything that might make someone trip, such as rocks or tools.  Regularly check to see if handrails are loose or broken. Make sure that both sides of any steps have handrails.  Any raised decks and porches should have guardrails on the edges.  Have any leaves, snow, or ice cleared regularly.  Use sand or salt on walking paths during winter.  Clean up any spills in your garage right away. This includes oil or grease spills. What can I do in the bathroom?  Use night lights.  Install grab bars by the toilet and in the tub and shower. Do not use towel bars as grab bars.  Use non-skid mats or decals in the tub or shower.  If you need to sit down in the shower, use a plastic, non-slip stool.  Keep the floor dry. Clean up any water that spills on the floor as soon as it happens.  Remove soap buildup in the tub or shower regularly.  Attach bath mats securely with double-sided non-slip rug tape.  Do not have throw rugs and other  things on the floor that can make you trip. What can I do in the bedroom?  Use night lights.  Make sure that you have a light by your bed that is easy to reach.  Do not use any sheets or blankets that are too big for your bed. They should not hang down onto the floor.  Have a firm chair that has side arms. You can use this for support while you get dressed.  Do not have throw rugs and other things on the floor that can make you trip. What can I do in the kitchen?  Clean up any spills right away.  Avoid walking on wet floors.  Keep items that you use a lot in easy-to-reach places.  If you need to reach something above you, use a strong step stool that has a grab bar.  Keep electrical cords out of the way.  Do not use floor polish or wax that makes floors slippery. If you must use wax, use non-skid floor wax.  Do not have throw rugs and other things on the floor that can make you trip. What can I do with my stairs?  Do not  leave any items on the stairs.  Make sure that there are handrails on both sides of the stairs and use them. Fix handrails that are broken or loose. Make sure that handrails are as long as the stairways.  Check any carpeting to make sure that it is firmly attached to the stairs. Fix any carpet that is loose or worn.  Avoid having throw rugs at the top or bottom of the stairs. If you do have throw rugs, attach them to the floor with carpet tape.  Make sure that you have a light switch at the top of the stairs and the bottom of the stairs. If you do not have them, ask someone to add them for you. What else can I do to help prevent falls?  Wear shoes that:  Do not have high heels.  Have rubber bottoms.  Are comfortable and fit you well.  Are closed at the toe. Do not wear sandals.  If you use a stepladder:  Make sure that it is fully opened. Do not climb a closed stepladder.  Make sure that both sides of the stepladder are locked into place.  Ask  someone to hold it for you, if possible.  Clearly mark and make sure that you can see:  Any grab bars or handrails.  First and last steps.  Where the edge of each step is.  Use tools that help you move around (mobility aids) if they are needed. These include:  Canes.  Walkers.  Scooters.  Crutches.  Turn on the lights when you go into a dark area. Replace any light bulbs as soon as they burn out.  Set up your furniture so you have a clear path. Avoid moving your furniture around.  If any of your floors are uneven, fix them.  If there are any pets around you, be aware of where they are.  Review your medicines with your doctor. Some medicines can make you feel dizzy. This can increase your chance of falling. Ask your doctor what other things that you can do to help prevent falls. This information is not intended to replace advice given to you by your health care provider. Make sure you discuss any questions you have with your health care provider. Document Released: 11/07/2008 Document Revised: 06/19/2015 Document Reviewed: 02/15/2014 Elsevier Interactive Patient Education  2017 [employer].

## 2019-04-17 ENCOUNTER — Encounter: Payer: Medicare Other | Admitting: Family Medicine

## 2019-05-03 ENCOUNTER — Other Ambulatory Visit: Payer: Self-pay | Admitting: Family Medicine

## 2019-05-10 ENCOUNTER — Encounter: Payer: Self-pay | Admitting: Internal Medicine

## 2019-05-18 ENCOUNTER — Other Ambulatory Visit: Payer: Self-pay | Admitting: *Deleted

## 2019-05-18 ENCOUNTER — Telehealth: Payer: Self-pay

## 2019-05-18 DIAGNOSIS — I10 Essential (primary) hypertension: Secondary | ICD-10-CM

## 2019-05-18 DIAGNOSIS — E785 Hyperlipidemia, unspecified: Secondary | ICD-10-CM

## 2019-05-18 NOTE — Telephone Encounter (Signed)
Please order lab work --appt coming up 06-07-19--send to [facility]

## 2019-05-18 NOTE — Telephone Encounter (Signed)
Labs were [facility] and [facility] to [facility]

## 2019-05-28 ENCOUNTER — Ambulatory Visit: Payer: Medicare Other | Admitting: Family Medicine

## 2019-05-29 DIAGNOSIS — R7301 Impaired fasting glucose: Secondary | ICD-10-CM | POA: Diagnosis not present

## 2019-05-29 DIAGNOSIS — E785 Hyperlipidemia, unspecified: Secondary | ICD-10-CM | POA: Diagnosis not present

## 2019-05-29 DIAGNOSIS — I10 Essential (primary) hypertension: Secondary | ICD-10-CM | POA: Diagnosis not present

## 2019-06-02 LAB — COMPLETE METABOLIC PANEL WITH GFR
AG Ratio: 1.1 (calc) (ref 1.0–2.5)
ALT: 8 U/L (ref 6–29)
AST: 10 U/L (ref 10–35)
Albumin: 4 g/dL (ref 3.6–5.1)
Alkaline phosphatase (APISO): 68 U/L (ref 37–153)
BUN/Creatinine Ratio: 12 (calc) (ref 6–22)
BUN: 11 mg/dL (ref 7–25)
CO2: 27 mmol/L (ref 20–32)
Calcium: 9.6 mg/dL (ref 8.6–10.4)
Chloride: 106 mmol/L (ref 98–110)
Creat: 0.9 mg/dL — ABNORMAL HIGH (ref 0.60–0.88)
GFR, Est African American: 70 mL/min/{1.73_m2} (ref >=60)
GFR, Est Non African American: 60 mL/min/{1.73_m2} (ref >=60)
Globulin: 3.5 g/dL (calc) (ref 1.9–3.7)
Glucose, Bld: 117 mg/dL — ABNORMAL HIGH (ref 65–99)
Potassium: 4.2 mmol/L (ref 3.5–5.3)
Sodium: 141 mmol/L (ref 135–146)
Total Bilirubin: 1.4 mg/dL — ABNORMAL HIGH (ref 0.2–1.2)
Total Protein: 7.5 g/dL (ref 6.1–8.1)

## 2019-06-02 LAB — CBC
HCT: 40.7 % (ref 35.0–45.0)
Hemoglobin: 13.5 g/dL (ref 11.7–15.5)
MCH: 29 pg (ref 27.0–33.0)
MCHC: 33.2 g/dL (ref 32.0–36.0)
MCV: 87.5 fL (ref 80.0–100.0)
MPV: 10.9 fL (ref 7.5–12.5)
Platelets: 403 10*3/uL — ABNORMAL HIGH (ref 140–400)
RBC: 4.65 10*6/uL (ref 3.80–5.10)
RDW: 12.5 % (ref 11.0–15.0)
WBC: 6.8 10*3/uL (ref 3.8–10.8)

## 2019-06-02 LAB — LIPID PANEL
Cholesterol: 185 mg/dL (ref <200)
HDL: 58 mg/dL (ref >=50)
LDL Cholesterol (Calc): 107 mg/dL (calc) — ABNORMAL HIGH
Non-HDL Cholesterol (Calc): 127 mg/dL (calc) (ref <130)
Total CHOL/HDL Ratio: 3.2 (calc) (ref <5.0)
Triglycerides: 103 mg/dL (ref <150)

## 2019-06-02 LAB — TEST AUTHORIZATION

## 2019-06-02 LAB — HEMOGLOBIN A1C
Hgb A1c MFr Bld: 5 % of total Hgb (ref <5.7)
Mean Plasma Glucose: 97 (calc)
eAG (mmol/L): 5.4 (calc)

## 2019-06-02 LAB — TSH: TSH: 2.17 mIU/L (ref 0.40–4.50)

## 2019-06-07 ENCOUNTER — Other Ambulatory Visit: Payer: Self-pay

## 2019-06-07 ENCOUNTER — Encounter: Payer: Self-pay | Admitting: Family Medicine

## 2019-06-07 ENCOUNTER — Telehealth (INDEPENDENT_AMBULATORY_CARE_PROVIDER_SITE_OTHER): Payer: Medicare Other | Admitting: Family Medicine

## 2019-06-07 VITALS — BP 124/84 | Ht 63.0 in | Wt 194.0 lb

## 2019-06-07 DIAGNOSIS — E6609 Other obesity due to excess calories: Secondary | ICD-10-CM

## 2019-06-07 DIAGNOSIS — E785 Hyperlipidemia, unspecified: Secondary | ICD-10-CM | POA: Diagnosis not present

## 2019-06-07 DIAGNOSIS — I10 Essential (primary) hypertension: Secondary | ICD-10-CM

## 2019-06-07 DIAGNOSIS — Z6833 Body mass index (BMI) 33.0-33.9, adult: Secondary | ICD-10-CM
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
## 2019-06-12 ENCOUNTER — Encounter: Payer: Self-pay | Admitting: Family Medicine

## 2019-06-12 NOTE — Patient Instructions (Addendum)
I appreciate the opportunity to provide you with care for your health and wellness. Today we discussed: overall health   Follow up: 5 months due to annual then  No labs or referrals today  Please continue to practice social distancing to keep you, your family, and our community safe.  If you must go out, please wear a mask and practice good handwashing.  It was a pleasure to see you and I look forward to continuing to work together on your health and well-being. Please do not hesitate to call the office if you need care or have questions about your care.  Have a wonderful day and week. With Gratitude, Cherly Beach, DNP, AGNP-BC

## 2019-06-12 NOTE — Assessment & Plan Note (Signed)
Encouraged a low fat diet. On statin. Recent labs demonstrate stable control.

## 2019-06-12 NOTE — Assessment & Plan Note (Signed)
Allison [name] is encouraged to maintain a well balanced diet that is low in salt. Controlled, continue current medication regimen. Will need a office visit next to check in office   Additionally, she is also reminded that exercise is beneficial for heart health and control of  Blood pressure. 30-60 minutes daily is recommended-walking was suggested.

## 2019-06-12 NOTE — Assessment & Plan Note (Signed)
Weight loss. Will check this in office in coming months. While still overweight would like to avoid drastic weight changes,  follow up on greater than 10 pounds due to possible conditions  that could cause it. She reports eating well and overall doing well. Will verify at next visit

## 2019-08-06 ENCOUNTER — Other Ambulatory Visit: Payer: Self-pay | Admitting: Family Medicine

## 2019-09-07 ENCOUNTER — Other Ambulatory Visit (HOSPITAL_COMMUNITY): Payer: Self-pay | Admitting: Family Medicine

## 2019-09-07 DIAGNOSIS — Z1231 Encounter for screening mammogram for malignant neoplasm of breast: Secondary | ICD-10-CM

## 2019-09-12 ENCOUNTER — Other Ambulatory Visit: Payer: Self-pay

## 2019-09-12 ENCOUNTER — Ambulatory Visit (HOSPITAL_COMMUNITY)
Admission: RE | Admit: 2019-09-12 | Discharge: 2019-09-12 | Disposition: A | Discharge status: Home / Self Care | Payer: Medicare Other | Source: Ambulatory Visit | Attending: Family Medicine | Admitting: Family Medicine

## 2019-09-12 DIAGNOSIS — Z1231 Encounter for screening mammogram for malignant neoplasm of breast: Secondary | ICD-10-CM | POA: Diagnosis not present

## 2019-11-01 ENCOUNTER — Encounter: Payer: Medicare Other | Admitting: Family Medicine

## 2019-11-05 ENCOUNTER — Other Ambulatory Visit: Payer: Self-pay | Admitting: Family Medicine

## 2019-11-13 ENCOUNTER — Other Ambulatory Visit: Payer: Self-pay

## 2019-11-13 DIAGNOSIS — I10 Essential (primary) hypertension: Secondary | ICD-10-CM

## 2019-11-13 DIAGNOSIS — E785 Hyperlipidemia, unspecified: Secondary | ICD-10-CM

## 2019-11-13 DIAGNOSIS — R7301 Impaired fasting glucose: Secondary | ICD-10-CM

## 2019-11-13 DIAGNOSIS — E559 Vitamin D deficiency, unspecified: Secondary | ICD-10-CM

## 2019-11-14 DIAGNOSIS — I10 Essential (primary) hypertension: Secondary | ICD-10-CM | POA: Diagnosis not present

## 2019-11-14 DIAGNOSIS — E785 Hyperlipidemia, unspecified: Secondary | ICD-10-CM | POA: Diagnosis not present

## 2019-11-14 DIAGNOSIS — E559 Vitamin D deficiency, unspecified: Secondary | ICD-10-CM | POA: Diagnosis not present

## 2019-11-14 DIAGNOSIS — R7301 Impaired fasting glucose: Secondary | ICD-10-CM | POA: Diagnosis not present

## 2019-11-15 LAB — COMPLETE METABOLIC PANEL WITH GFR
AG Ratio: 1.2 (calc) (ref 1.0–2.5)
ALT: 9 U/L (ref 6–29)
AST: 12 U/L (ref 10–35)
Albumin: 4.1 g/dL (ref 3.6–5.1)
Alkaline phosphatase (APISO): 70 U/L (ref 37–153)
BUN/Creatinine Ratio: 12 (calc) (ref 6–22)
BUN: 11 mg/dL (ref 7–25)
CO2: 29 mmol/L (ref 20–32)
Calcium: 9.5 mg/dL (ref 8.6–10.4)
Chloride: 104 mmol/L (ref 98–110)
Creat: 0.89 mg/dL — ABNORMAL HIGH (ref 0.60–0.88)
GFR, Est African American: 70 mL/min/{1.73_m2} (ref >=60)
GFR, Est Non African American: 61 mL/min/{1.73_m2} (ref >=60)
Globulin: 3.3 g/dL (calc) (ref 1.9–3.7)
Glucose, Bld: 98 mg/dL (ref 65–99)
Potassium: 4.1 mmol/L (ref 3.5–5.3)
Sodium: 141 mmol/L (ref 135–146)
Total Bilirubin: 1 mg/dL (ref 0.2–1.2)
Total Protein: 7.4 g/dL (ref 6.1–8.1)

## 2019-11-15 LAB — LIPID PANEL
Cholesterol: 185 mg/dL (ref <200)
HDL: 70 mg/dL (ref >=50)
LDL Cholesterol (Calc): 96 mg/dL (calc)
Non-HDL Cholesterol (Calc): 115 mg/dL (calc) (ref <130)
Total CHOL/HDL Ratio: 2.6 (calc) (ref <5.0)
Triglycerides: 101 mg/dL (ref <150)

## 2019-11-15 LAB — HEMOGLOBIN A1C
Hgb A1c MFr Bld: 5.3 % of total Hgb (ref <5.7)
Mean Plasma Glucose: 105 (calc)
eAG (mmol/L): 5.8 (calc)

## 2019-11-15 LAB — VITAMIN D 25 HYDROXY (VIT D DEFICIENCY, FRACTURES): Vit D, 25-Hydroxy: 18 ng/mL — ABNORMAL LOW (ref 30–100)

## 2019-11-19 ENCOUNTER — Encounter: Payer: Self-pay | Admitting: Family Medicine

## 2019-11-19 ENCOUNTER — Other Ambulatory Visit: Payer: Self-pay

## 2019-11-19 ENCOUNTER — Ambulatory Visit (INDEPENDENT_AMBULATORY_CARE_PROVIDER_SITE_OTHER): Payer: Medicare Other | Admitting: Family Medicine

## 2019-11-19 VITALS — BP 120/84 | HR 70 | Resp 16 | Ht 62.5 in | Wt 189.1 lb

## 2019-11-19 DIAGNOSIS — Z0000 Encounter for general adult medical examination without abnormal findings: Secondary | ICD-10-CM

## 2019-11-19 DIAGNOSIS — E785 Hyperlipidemia, unspecified: Secondary | ICD-10-CM

## 2019-11-19 DIAGNOSIS — R7301 Impaired fasting glucose: Secondary | ICD-10-CM

## 2019-11-19 DIAGNOSIS — E559 Vitamin D deficiency, unspecified: Secondary | ICD-10-CM

## 2019-11-19 DIAGNOSIS — Z23 Encounter for immunization: Secondary | ICD-10-CM

## 2019-11-19 DIAGNOSIS — I10 Essential (primary) hypertension: Secondary | ICD-10-CM

## 2019-11-19 MED ORDER — ERGOCALCIFEROL 1.25 MG (50000 UT) PO CAPS: 50000.0000 [IU] | ORAL_CAPSULE | ORAL | 1 refills | Status: DC

## 2019-11-19 NOTE — Assessment & Plan Note (Signed)

## 2019-11-19 NOTE — Progress Notes (Signed)
    Allison [name]     MRN: [id number]      DOB: Sep 18, 1940  HPI: Patient is in for annual physical exam. No other health concerns are expressed or addressed at the visit. Recent labs, if available are reviewed. Immunization is reviewed , and  updated if needed.   PE: BP 120/84   Pulse 70   Resp 16   Ht 5' 2.5" (1.588 m)   Wt 189 lb 1.9 oz (85.8 kg)   SpO2 96%   BMI 34.04 kg/m   Pleasant  female, alert and oriented x 3, in no cardio-pulmonary distress. Afebrile. HEENT No facial trauma or asymetry. Sinuses non tender.  Extra occullar muscles intact.. External ears normal, . Neck: supple, no adenopathy,JVD or thyromegaly.No bruits.  Chest: Clear to ascultation bilaterally.No crackles or wheezes. Non tender to palpation  Breast: Not examined. Normal mammogram in 08/2019   Cardiovascular system; Heart sounds normal,  S1 and  S2 ,no S3.  No murmur, or thrill. Apical beat not displaced Peripheral pulses normal.  Abdomen: Soft, non tender, no organomegaly or masses. No bruits. Bowel sounds normal. No guarding, tenderness or rebound.     Musculoskeletal exam: Full ROM of spine, hips , shoulders and knees. No deformity ,swelling or crepitus noted. No muscle wasting or atrophy.   Neurologic: Cranial nerves 2 to 12 intact. Power, tone ,sensation and reflexes normal throughout. No disturbance in gait. No tremor.  Skin: Intact, no ulceration, erythema , scaling or rash noted. Pigmentation normal throughout  Psych; Normal mood and affect. Judgement and concentration normal   Assessment & Plan:  Vitamin D deficiency neds weekly vit D x 6 months  Encounter for annual general medical examination without abnormal findings in adult Annual exam as documented. Counseling done  re healthy lifestyle involving commitment to 150 minutes exercise per week, heart healthy diet, and attaining healthy weight.The importance of adequate sleep also discussed. Regular seat  belt use and home safety, is also discussed. Changes in health habits are decided on by the patient with goals and time frames  set for achieving them. Immunization and cancer screening needs are specifically addressed at this visit.

## 2019-11-19 NOTE — Patient Instructions (Addendum)
Follow-up in office with MD in 6 months call if you need me sooner.  Flu vaccvine in office today Please schedule your eye exam, currently due  Fasting CBC, cmp and eGFR, lipid and EGFr, vit d and tSH first week in April  New is once weekly vitamin D 50,000 units for the next 6 months.  Continue calcium with D two  capsules once daily.  You may discontinue aspirin there is no value per recent guidelines in taking aspirin and the risk of intestinal bleed is great You may get your Covid booster in the next week.  Please commit to regular physical activity for health and bone strengthening for example walking for 30 minutes 5 days each week.  Think about what you will eat, plan ahead. Choose " clean, green, fresh or frozen" over canned, processed or packaged foods which are more sugary, salty and fatty. 70 to 75% of food eaten should be vegetables and fruit. Three meals at set times with snacks allowed between meals, but they must be fruit or vegetables. Aim to eat over a 12 hour period , example 7 am to 7 pm, and STOP after  your last meal of the day. Drink water,generally about 64 ounces per day, no other drink is as healthy. Fruit juice is best enjoyed in a healthy way, by EATING the fruit.  One cup of regular caffeine daily will help your bowels along with prune jiuice and at least 64 ounces water daily  Please keep home environment safe, good lighting and no clutter  Thanks for choosing [facility], we consider it a privelige to serve you.

## 2019-11-19 NOTE — Assessment & Plan Note (Signed)
neds weekly vit D x 6 months

## 2019-11-24 ENCOUNTER — Encounter: Payer: Self-pay | Admitting: Family Medicine

## 2020-01-01 ENCOUNTER — Ambulatory Visit (HOSPITAL_COMMUNITY)
Admission: RE | Admit: 2020-01-01 | Discharge: 2020-01-01 | Disposition: A | Discharge status: Home / Self Care | Payer: Medicare Other | Source: Ambulatory Visit | Attending: Family Medicine | Admitting: Family Medicine

## 2020-01-01 ENCOUNTER — Other Ambulatory Visit: Payer: Self-pay

## 2020-01-01 ENCOUNTER — Ambulatory Visit (INDEPENDENT_AMBULATORY_CARE_PROVIDER_SITE_OTHER): Payer: Medicare Other | Admitting: Family Medicine

## 2020-01-01 ENCOUNTER — Encounter: Payer: Self-pay | Admitting: Family Medicine

## 2020-01-01 VITALS — BP 150/94 | HR 71 | Resp 14 | Ht 62.5 in | Wt 189.0 lb

## 2020-01-01 DIAGNOSIS — M19072 Primary osteoarthritis, left ankle and foot: Secondary | ICD-10-CM | POA: Diagnosis not present

## 2020-01-01 DIAGNOSIS — M79672 Pain in left foot: Secondary | ICD-10-CM | POA: Insufficient documentation

## 2020-01-01 DIAGNOSIS — I10 Essential (primary) hypertension: Secondary | ICD-10-CM

## 2020-01-01 MED ORDER — AMLODIPINE BESYLATE 5 MG PO TABS: 5.0000 mg | ORAL_TABLET | Freq: Every day | ORAL | 3 refills | Status: DC

## 2020-01-01 NOTE — Patient Instructions (Signed)
F/u in 8 weeks with MD , call if you need me before  Please get X ray of left foot today, you have a sprain  Take tylenol 500 mg one twice daily for next 1 week  New higher dose of amlodipine is 5 mg one daily, this is sent in today  Thanks for choosing [facility], we consider it a privelige to serve you.

## 2020-01-06 ENCOUNTER — Encounter: Payer: Self-pay | Admitting: Family Medicine

## 2020-01-06 NOTE — Progress Notes (Signed)
   Allison [name]     MRN: [id number]      DOB: [birth year]   HPI Allison [name] is here with a c/o left foot and anlkle ROS Denies recent fever or chills. Denies sinus pressure, nasal congestion, ear pain or sore throat. Denies chest congestion, productive cough or wheezing. Denies chest pains, palpitations and leg swelling Denies abdominal pain, nausea, vomiting,diarrhea or constipation.   Denies dysuria, frequency, hesitancy or incontinence. Denies joint pain, swelling and limitation in mobility. Denies headaches, seizures, numbness, or tingling. Denies depression, anxiety or insomnia. Denies skin break down or rash.   PE  BP (!) 150/94   Pulse 71   Resp 14   Ht 5' 2.5" (1.588 m)   Wt 189 lb (85.7 kg)   SpO2 96%   BMI 34.02 kg/m   Patient alert and oriented and in no cardiopulmonary distress.  HEENT: No facial asymmetry, EOMI,     Neck supple .  Chest: Clear to auscultation bilaterally.  CVS: S1, S2 no murmurs, no S3.Regular rate.  ABD: Soft non tender.   Ext: No edema  MS: Adequate ROM spine, shoulders, hips and knees.swelling and tenderness of left foot anf ankle  Skin: Intact, no ulcerations or rash noted.  Psych: Good eye contact, normal affect. Memory intact not anxious or depressed appearing.  CNS: CN 2-12 intact, power,  normal throughout.no focal deficits noted.   Assessment & Plan  Acute foot pain, left Left foot ankle swelling and tenderness s/p fall, obtain X ray, use tylenol for pain  HTN (hypertension) unControlled, no change in medication.pt in pain DASH diet and commitment to daily physical activity for a minimum of 30 minutes discussed and encouraged, as a part of hypertension management. The importance of attaining a healthy weight is also discussed.  BP/Weight 01/01/2020 11/19/2019 06/07/2019 04/16/2019 10/30/2018 05/01/2018 10/0/7121  Systolic BP 975 [phone] 094  Diastolic BP 94 84 84 84 84 82 82  Wt. (Lbs) 189 189.12 194 194 194  187 187.12  BMI 34.02 34.04 34.37 34.37 34.37 33.13 33.15

## 2020-01-06 NOTE — Assessment & Plan Note (Signed)
Left foot ankle swelling and tenderness s/p fall, obtain X ray, use tylenol for pain

## 2020-01-06 NOTE — Assessment & Plan Note (Signed)
unControlled, no change in medication.pt in pain DASH diet and commitment to daily physical activity for a minimum of 30 minutes discussed and encouraged, as a part of hypertension management. The importance of attaining a healthy weight is also discussed.  BP/Weight 01/01/2020 11/19/2019 06/07/2019 04/16/2019 10/30/2018 05/01/2018 05/0/5678  Systolic BP 893 388 266 664 861 612 240  Diastolic BP 94 84 84 84 84 82 82  Wt. (Lbs) 189 189.12 194 194 194 187 187.12  BMI 34.02 34.04 34.37 34.37 34.37 33.13 33.15

## 2020-01-07 DIAGNOSIS — Z961 Presence of intraocular lens: Secondary | ICD-10-CM | POA: Diagnosis not present

## 2020-02-01 ENCOUNTER — Other Ambulatory Visit: Payer: Self-pay | Admitting: Family Medicine

## 2020-02-26 ENCOUNTER — Encounter: Payer: Self-pay | Admitting: Family Medicine

## 2020-02-26 ENCOUNTER — Other Ambulatory Visit: Payer: Self-pay

## 2020-02-26 ENCOUNTER — Telehealth (INDEPENDENT_AMBULATORY_CARE_PROVIDER_SITE_OTHER): Payer: Medicare Other | Admitting: Family Medicine

## 2020-02-26 VITALS — Ht 62.5 in | Wt 180.0 lb

## 2020-02-26 DIAGNOSIS — R7301 Impaired fasting glucose: Secondary | ICD-10-CM

## 2020-02-26 DIAGNOSIS — M8580 Other specified disorders of bone density and structure, unspecified site: Secondary | ICD-10-CM | POA: Diagnosis not present

## 2020-02-26 DIAGNOSIS — I10 Essential (primary) hypertension: Secondary | ICD-10-CM

## 2020-02-26 DIAGNOSIS — E559 Vitamin D deficiency, unspecified: Secondary | ICD-10-CM

## 2020-02-26 DIAGNOSIS — K5900 Constipation, unspecified: Secondary | ICD-10-CM

## 2020-02-26 DIAGNOSIS — E785 Hyperlipidemia, unspecified: Secondary | ICD-10-CM

## 2020-02-26 DIAGNOSIS — Z6833 Body mass index (BMI) 33.0-33.9, adult: Secondary | ICD-10-CM

## 2020-02-26 DIAGNOSIS — E6609 Other obesity due to excess calories: Secondary | ICD-10-CM

## 2020-02-26 DIAGNOSIS — M79672 Pain in left foot: Secondary | ICD-10-CM | POA: Diagnosis not present

## 2020-02-26 DIAGNOSIS — E66811 Obesity, class 1: Secondary | ICD-10-CM

## 2020-02-26 NOTE — Progress Notes (Signed)
Virtual Visit via Telephone Note  I connected with Allison [name] on 02/26/20 at 10:20 AM EST by telephone and verified that I am speaking with the correct person using two identifiers.  Location: Patient:home Provider: office    I discussed the limitations, risks, security and privacy concerns of performing an evaluation and management service by telephone and the availability of in person appointments. I also discussed with the patient that there may be a patient responsible charge related to this service. The patient expressed understanding and agreed to proceed.   History of Present Illness: F/U chronic problems and address any new or current concerns. Review and update medications and allergies. Review recent lab and radiologic data . Update routine health maintainace. Review an encourage improved health habits to include nutrition, exercise and  sleep .  Denies recent fever or chills. Denies sinus pressure, nasal congestion, ear pain or sore throat. Denies chest congestion, productive cough or wheezing. Denies chest pains, palpitations and leg swelling Denies abdominal pain, nausea, vomiting,c/ohard stool and  constipation.   Denies dysuria, frequency, hesitancy or incontinence. Denies joint pain, swelling and limitation in mobility. Denies headaches, seizures, numbness, or tingling. Denies depression, anxiety or insomnia. Denies skin break down or rash.       Observations/Objective: Ht 5' 2.5" (1.588 m)   Wt 180 lb (81.6 kg)   BMI 32.40 kg/m  Good communication with no confusion and intact memory. Alert and oriented x 3 No signs of respiratory distress during speech    Assessment and Plan:  Essential hypertension DASH diet and commitment to daily physical activity for a minimum of 30 minutes discussed and encouraged, as a part of hypertension management. The importance of attaining a healthy weight is also discussed.  BP/Weight 02/26/2020 01/01/2020 11/19/2019  06/07/2019 04/16/2019 43/01/5398 08/30/7617  Systolic BP - 509 326 712 458 099 833  Diastolic BP - 94 84 84 84 84 82  Wt. (Lbs) 180 189 189.12 194 194 194 187  BMI 32.4 34.02 34.04 34.37 34.37 34.37 33.13     Needs in office visit for re evaluation  Acute foot pain, left Resolved and negative X ray reassuring to patient  Constipation Educated again re use of stool softeners and fiber daily with laxative every 3 days if needed, also re food choice and exercise to promote regular BM  Hyperlipidemia LDL goal <100 Hyperlipidemia:Low fat diet discussed and encouraged.   Lipid Panel  Lab Results  Component Value Date   CHOL 185 11/14/2019   HDL 70 11/14/2019   LDLCALC 96 11/14/2019   TRIG 101 11/14/2019   CHOLHDL 2.6 11/14/2019   Updated lab needed at/ before next visit. Controlled, no change in medication     Obesity  Patient re-educated about  the importance of commitment to a  minimum of 150 minutes of exercise per week as able.  The importance of healthy food choices with portion control discussed, as well as eating regularly and within a 12 hour window most days. The need to choose "clean , green" food 50 to 75% of the time is discussed, as well as to make water the primary drink and set a goal of 64 ounces water daily.    Weight /BMI 02/26/2020 01/01/2020 11/19/2019  WEIGHT 180 lb 189 lb 189 lb 1.9 oz  HEIGHT 5' 2.5" 5' 2.5" 5' 2.5"  BMI 32.4 kg/m2 34.02 kg/m2 34.04 kg/m2       Follow Up Instructions:    I discussed the assessment and treatment plan with  the patient. The patient was provided an opportunity to ask questions and all were answered. The patient agreed with the plan and demonstrated an understanding of the instructions.   The patient was advised to call back or seek an in-person evaluation if the symptoms worsen or if the condition fails to improve as anticipated.  I provided 24 inutes of non-face-to-face time during this encounter.   Tula Nakayama, MD

## 2020-02-26 NOTE — Patient Instructions (Addendum)
F/U in office re evaluate blood pressure and labs in 4.5 months, call if you need me sooner  Please check your blood pressure at the pharmacy, need to have a blood pressure less than 140/90 for control  Fasting lipid, cmp and and EGFr , CBC, TSH and vit D 1 week before next visit   Take TWO stool softeners every day  Take dulcolax one tablet every 3 days if no bowel movement   Ensure you drink 64 ounces water daily  Eat mainly vegetables and fruit, fresh or frozen  Commit to 2 0  To 30 minutes of exercise , 5 days per week    One vitamin once daily for nails, hair and skin is good  Apple cider vinegar is

## 2020-03-02 ENCOUNTER — Encounter: Payer: Self-pay | Admitting: Family Medicine

## 2020-03-02 NOTE — Assessment & Plan Note (Signed)
  Patient re-educated about  the importance of commitment to a  minimum of 150 minutes of exercise per week as able.  The importance of healthy food choices with portion control discussed, as well as eating regularly and within a 12 hour window most days. The need to choose "clean , green" food 50 to 75% of the time is discussed, as well as to make water the primary drink and set a goal of 64 ounces water daily.    Weight /BMI 02/26/2020 01/01/2020 11/19/2019  WEIGHT 180 lb 189 lb 189 lb 1.9 oz  HEIGHT 5' 2.5" 5' 2.5" 5' 2.5"  BMI 32.4 kg/m2 34.02 kg/m2 34.04 kg/m2

## 2020-03-02 NOTE — Assessment & Plan Note (Signed)
Educated again re use of stool softeners and fiber daily with laxative every 3 days if needed, also re food choice and exercise to promote regular BM

## 2020-03-02 NOTE — Assessment & Plan Note (Signed)
DASH diet and commitment to daily physical activity for a minimum of 30 minutes discussed and encouraged, as a part of hypertension management. The importance of attaining a healthy weight is also discussed.  BP/Weight 02/26/2020 01/01/2020 11/19/2019 06/07/2019 04/16/2019 10/25/7508 03/01/8525  Systolic BP - 782 423 536 144 315 400  Diastolic BP - 94 84 84 84 84 82  Wt. (Lbs) 180 189 189.12 194 194 194 187  BMI 32.4 34.02 34.04 34.37 34.37 34.37 33.13     Needs in office visit for re evaluation

## 2020-03-02 NOTE — Assessment & Plan Note (Signed)
Hyperlipidemia:Low fat diet discussed and encouraged.   Lipid Panel  Lab Results  Component Value Date   CHOL 185 11/14/2019   HDL 70 11/14/2019   LDLCALC 96 11/14/2019   TRIG 101 11/14/2019   CHOLHDL 2.6 11/14/2019   Updated lab needed at/ before next visit. Controlled, no change in medication

## 2020-03-02 NOTE — Assessment & Plan Note (Signed)
Resolved and negative X ray reassuring to patient

## 2020-04-04 ENCOUNTER — Other Ambulatory Visit: Payer: Self-pay | Admitting: Family Medicine

## 2020-05-05 ENCOUNTER — Other Ambulatory Visit: Payer: Self-pay | Admitting: Family Medicine

## 2020-05-07 ENCOUNTER — Telehealth: Payer: Self-pay | Admitting: Family Medicine

## 2020-05-07 NOTE — Telephone Encounter (Signed)
Tried calling patient to  schedule Medicare Annual Wellness Visit (AWV) either virtually or in office.  No answer    Last AWV 04/16/19  please schedule at anytime with [facility]  health coach  This should be a 40 minute visit.

## 2020-05-19 ENCOUNTER — Ambulatory Visit: Payer: Medicare Other | Admitting: Family Medicine

## 2020-05-27 ENCOUNTER — Ambulatory Visit (INDEPENDENT_AMBULATORY_CARE_PROVIDER_SITE_OTHER): Payer: Medicare Other

## 2020-05-27 ENCOUNTER — Other Ambulatory Visit: Payer: Self-pay

## 2020-05-27 VITALS — Ht 62.5 in | Wt 180.0 lb

## 2020-05-27 DIAGNOSIS — Z0000 Encounter for general adult medical examination without abnormal findings: Secondary | ICD-10-CM | POA: Diagnosis not present

## 2020-05-27 NOTE — Patient Instructions (Signed)
Allison [name] , Thank you for taking time to come for your Medicare Wellness Visit. I appreciate your ongoing commitment to your health goals. Please review the following plan we discussed and let me know if I can assist you in the future.   Screening recommendations/referrals: Colonoscopy: no longer needed  Mammogram: up to date until 08/2020 Bone Density: up to date  Recommended yearly ophthalmology/optometry visit for glaucoma screening and checkup Recommended yearly dental visit for hygiene and checkup  Vaccinations: Influenza vaccine: up to date  Pneumococcal vaccine: up to date  Tdap vaccine: up to date  Shingles vaccine: can get at pharmacy         Next appointment: wellness in 1 year    Preventive Care 65 Years and Older, Female Preventive care refers to lifestyle choices and visits with your health care provider that can promote health and wellness. What does preventive care include?  A yearly physical exam. This is also called an annual well check.  Dental exams once or twice a year.  Routine eye exams. Ask your health care provider how often you should have your eyes checked.  Personal lifestyle choices, including:  Daily care of your teeth and gums.  Regular physical activity.  Eating a healthy diet.  Avoiding tobacco and drug use.  Limiting alcohol use.  Practicing safe sex.  Taking low-dose aspirin every day.  Taking vitamin and mineral supplements as recommended by your health care provider. What happens during an annual well check? The services and screenings done by your health care provider during your annual well check will depend on your age, overall health, lifestyle risk factors, and family history of disease. Counseling  Your health care provider may ask you questions about your:  Alcohol use.  Tobacco use.  Drug use.  Emotional well-being.  Home and relationship well-being.  Sexual activity.  Eating habits.  History of  falls.  Memory and ability to understand (cognition).  Work and work Statistician.  Reproductive health. Screening  You may have the following tests or measurements:  Height, weight, and BMI.  Blood pressure.  Lipid and cholesterol levels. These may be checked every 5 years, or more frequently if you are over 23 years old.  Skin check.  Lung cancer screening. You may have this screening every year starting at age 45 if you have a 30-pack-year history of smoking and currently smoke or have quit within the past 15 years.  Fecal occult blood test (FOBT) of the stool. You may have this test every year starting at age 59.  Flexible sigmoidoscopy or colonoscopy. You may have a sigmoidoscopy every 5 years or a colonoscopy every 10 years starting at age 65.  Hepatitis C blood test.  Hepatitis B blood test.  Sexually transmitted disease (STD) testing.  Diabetes screening. This is done by checking your blood sugar (glucose) after you have not eaten for a while (fasting). You may have this done every 1-3 years.  Bone density scan. This is done to screen for osteoporosis. You may have this done starting at age 63.  Mammogram. This may be done every 1-2 years. Talk to your health care provider about how often you should have regular mammograms. Talk with your health care provider about your test results, treatment options, and if necessary, the need for more tests. Vaccines  Your health care provider may recommend certain vaccines, such as:  Influenza vaccine. This is recommended every year.  Tetanus, diphtheria, and acellular pertussis (Tdap, Td) vaccine. You may need  a Td booster every 10 years.  Zoster vaccine. You may need this after age 40.  Pneumococcal 13-valent conjugate (PCV13) vaccine. One dose is recommended after age 68.  Pneumococcal polysaccharide (PPSV23) vaccine. One dose is recommended after age 26. Talk to your health care provider about which screenings and  vaccines you need and how often you need them. This information is not intended to replace advice given to you by your health care provider. Make sure you discuss any questions you have with your health care provider. Document Released: 02/07/2015 Document Revised: 10/01/2015 Document Reviewed: 11/12/2014 Elsevier Interactive Patient Education  2017 [place] Prevention in the Home Falls can cause injuries. They can happen to people of all ages. There are many things you can do to make your home safe and to help prevent falls. What can I do on the outside of my home?  Regularly fix the edges of walkways and driveways and fix any cracks.  Remove anything that might make you trip as you walk through a door, such as a raised step or threshold.  Trim any bushes or trees on the path to your home.  Use bright outdoor lighting.  Clear any walking paths of anything that might make someone trip, such as rocks or tools.  Regularly check to see if handrails are loose or broken. Make sure that both sides of any steps have handrails.  Any raised decks and porches should have guardrails on the edges.  Have any leaves, snow, or ice cleared regularly.  Use sand or salt on walking paths during winter.  Clean up any spills in your garage right away. This includes oil or grease spills. What can I do in the bathroom?  Use night lights.  Install grab bars by the toilet and in the tub and shower. Do not use towel bars as grab bars.  Use non-skid mats or decals in the tub or shower.  If you need to sit down in the shower, use a plastic, non-slip stool.  Keep the floor dry. Clean up any water that spills on the floor as soon as it happens.  Remove soap buildup in the tub or shower regularly.  Attach bath mats securely with double-sided non-slip rug tape.  Do not have throw rugs and other things on the floor that can make you trip. What can I do in the bedroom?  Use night  lights.  Make sure that you have a light by your bed that is easy to reach.  Do not use any sheets or blankets that are too big for your bed. They should not hang down onto the floor.  Have a firm chair that has side arms. You can use this for support while you get dressed.  Do not have throw rugs and other things on the floor that can make you trip. What can I do in the kitchen?  Clean up any spills right away.  Avoid walking on wet floors.  Keep items that you use a lot in easy-to-reach places.  If you need to reach something above you, use a strong step stool that has a grab bar.  Keep electrical cords out of the way.  Do not use floor polish or wax that makes floors slippery. If you must use wax, use non-skid floor wax.  Do not have throw rugs and other things on the floor that can make you trip. What can I do with my stairs?  Do not leave any items on the  stairs.  Make sure that there are handrails on both sides of the stairs and use them. Fix handrails that are broken or loose. Make sure that handrails are as long as the stairways.  Check any carpeting to make sure that it is firmly attached to the stairs. Fix any carpet that is loose or worn.  Avoid having throw rugs at the top or bottom of the stairs. If you do have throw rugs, attach them to the floor with carpet tape.  Make sure that you have a light switch at the top of the stairs and the bottom of the stairs. If you do not have them, ask someone to add them for you. What else can I do to help prevent falls?  Wear shoes that:  Do not have high heels.  Have rubber bottoms.  Are comfortable and fit you well.  Are closed at the toe. Do not wear sandals.  If you use a stepladder:  Make sure that it is fully opened. Do not climb a closed stepladder.  Make sure that both sides of the stepladder are locked into place.  Ask someone to hold it for you, if possible.  Clearly mark and make sure that you can  see:  Any grab bars or handrails.  First and last steps.  Where the edge of each step is.  Use tools that help you move around (mobility aids) if they are needed. These include:  Canes.  Walkers.  Scooters.  Crutches.  Turn on the lights when you go into a dark area. Replace any light bulbs as soon as they burn out.  Set up your furniture so you have a clear path. Avoid moving your furniture around.  If any of your floors are uneven, fix them.  If there are any pets around you, be aware of where they are.  Review your medicines with your doctor. Some medicines can make you feel dizzy. This can increase your chance of falling. Ask your doctor what other things that you can do to help prevent falls. This information is not intended to replace advice given to you by your health care provider. Make sure you discuss any questions you have with your health care provider. Document Released: 11/07/2008 Document Revised: 06/19/2015 Document Reviewed: 02/15/2014 Elsevier Interactive Patient Education  2017 [employer].
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
## 2020-06-05 ENCOUNTER — Other Ambulatory Visit: Payer: Self-pay | Admitting: Family Medicine

## 2020-07-08 ENCOUNTER — Other Ambulatory Visit: Payer: Self-pay | Admitting: Family Medicine

## 2020-07-10 DIAGNOSIS — I10 Essential (primary) hypertension: Secondary | ICD-10-CM | POA: Diagnosis not present

## 2020-07-10 DIAGNOSIS — E785 Hyperlipidemia, unspecified: Secondary | ICD-10-CM | POA: Diagnosis not present

## 2020-07-10 DIAGNOSIS — R7301 Impaired fasting glucose: Secondary | ICD-10-CM | POA: Diagnosis not present

## 2020-07-10 DIAGNOSIS — E559 Vitamin D deficiency, unspecified: Secondary | ICD-10-CM | POA: Diagnosis not present

## 2020-07-11 LAB — CBC WITH DIFFERENTIAL
Basophils Absolute: 0.1 10*3/uL (ref 0.0–0.2)
Basos: 1 %
EOS (ABSOLUTE): 0.2 10*3/uL (ref 0.0–0.4)
Eos: 3 %
Hematocrit: 40.9 % (ref 34.0–46.6)
Hemoglobin: 13.5 g/dL (ref 11.1–15.9)
Immature Grans (Abs): 0 10*3/uL (ref 0.0–0.1)
Immature Granulocytes: 0 %
Lymphocytes Absolute: 3.2 10*3/uL — ABNORMAL HIGH (ref 0.7–3.1)
Lymphs: 52 %
MCH: 29.3 pg (ref 26.6–33.0)
MCHC: 33 g/dL (ref 31.5–35.7)
MCV: 89 fL (ref 79–97)
Monocytes Absolute: 0.4 10*3/uL (ref 0.1–0.9)
Monocytes: 6 %
Neutrophils Absolute: 2.4 10*3/uL (ref 1.4–7.0)
Neutrophils: 38 %
RBC: 4.61 x10E6/uL (ref 3.77–5.28)
RDW: 12.6 % (ref 11.7–15.4)
WBC: 6.1 10*3/uL (ref 3.4–10.8)

## 2020-07-11 LAB — LIPID PANEL
Chol/HDL Ratio: 2.7 ratio (ref 0.0–4.4)
Cholesterol, Total: 188 mg/dL (ref 100–199)
HDL: 69 mg/dL
LDL Chol Calc (NIH): 102 mg/dL — ABNORMAL HIGH (ref 0–99)
Triglycerides: 96 mg/dL (ref 0–149)
VLDL Cholesterol Cal: 17 mg/dL (ref 5–40)

## 2020-07-11 LAB — TSH: TSH: 2.28 u[IU]/mL (ref 0.450–4.500)

## 2020-07-11 LAB — CMP14+EGFR
ALT: 8 IU/L (ref 0–32)
AST: 8 IU/L (ref 0–40)
Albumin/Globulin Ratio: 1.3 (ref 1.2–2.2)
Albumin: 4.2 g/dL (ref 3.6–4.6)
Alkaline Phosphatase: 89 IU/L (ref 44–121)
BUN/Creatinine Ratio: 10 — ABNORMAL LOW (ref 12–28)
BUN: 9 mg/dL (ref 8–27)
Bilirubin Total: 1.4 mg/dL — ABNORMAL HIGH (ref 0.0–1.2)
CO2: 21 mmol/L (ref 20–29)
Calcium: 9.6 mg/dL (ref 8.7–10.3)
Chloride: 106 mmol/L (ref 96–106)
Creatinine, Ser: 0.88 mg/dL (ref 0.57–1.00)
Globulin, Total: 3.2 g/dL (ref 1.5–4.5)
Glucose: 98 mg/dL (ref 65–99)
Potassium: 4 mmol/L (ref 3.5–5.2)
Sodium: 141 mmol/L (ref 134–144)
Total Protein: 7.4 g/dL (ref 6.0–8.5)
eGFR: 66 mL/min/{1.73_m2} (ref >59)

## 2020-07-11 LAB — VITAMIN D 25 HYDROXY (VIT D DEFICIENCY, FRACTURES): Vit D, 25-Hydroxy: 40.9 ng/mL (ref 30.0–100.0)

## 2020-07-15 ENCOUNTER — Encounter: Payer: Self-pay | Admitting: Family Medicine

## 2020-07-15 ENCOUNTER — Ambulatory Visit (INDEPENDENT_AMBULATORY_CARE_PROVIDER_SITE_OTHER): Payer: Medicare Other | Admitting: Family Medicine

## 2020-07-15 ENCOUNTER — Other Ambulatory Visit: Payer: Self-pay

## 2020-07-15 VITALS — BP 142/94 | HR 67 | Resp 16 | Ht 63.0 in | Wt 186.0 lb

## 2020-07-15 DIAGNOSIS — I10 Essential (primary) hypertension: Secondary | ICD-10-CM | POA: Diagnosis not present

## 2020-07-15 DIAGNOSIS — E785 Hyperlipidemia, unspecified: Secondary | ICD-10-CM

## 2020-07-15 DIAGNOSIS — Z6833 Body mass index (BMI) 33.0-33.9, adult: Secondary | ICD-10-CM

## 2020-07-15 DIAGNOSIS — E6609 Other obesity due to excess calories: Secondary | ICD-10-CM | POA: Diagnosis not present

## 2020-07-15 MED ORDER — SPIRONOLACTONE 25 MG PO TABS: 25.0000 mg | ORAL_TABLET | Freq: Every day | ORAL | 3 refills | Status: DC

## 2020-07-15 NOTE — Patient Instructions (Signed)
F/U end August, call if you need me sooner  New additional medication for blood pressure is spironolactone 25 mg take every morning. Continue amlodipine 5 mg daily for your blood pressure  Once you finish the once weekly vit D , then you can stop that and use OTC calcium with Vit D   It is important that you exercise regularly at least 30 minutes 5 times a week. If you develop chest pain, have severe difficulty breathing, or feel very tired, stop exercising immediately and seek medical attention    Think about what you will eat, plan ahead. Choose " clean, green, fresh or frozen" over canned, processed or packaged foods which are more sugary, salty and fatty. 70 to 75% of food eaten should be vegetables and fruit. Three meals at set times with snacks allowed between meals, but they must be fruit or vegetables. Aim to eat over a 12 hour period , example 7 am to 7 pm, and STOP after  your last meal of the day. Drink water,generally about 64 ounces per day, no other drink is as healthy. Fruit juice is best enjoyed in a healthy way, by EATING the fruit. Thanks for choosing [facility], we consider it a privelige to serve you.

## 2020-07-20 ENCOUNTER — Encounter: Payer: Self-pay | Admitting: Family Medicine

## 2020-07-20 NOTE — Assessment & Plan Note (Signed)
Hyperlipidemia:Low fat diet discussed and encouraged.   Lipid Panel  Lab Results  Component Value Date   CHOL 188 07/10/2020   HDL 69 07/10/2020   LDLCALC 102 (H) 07/10/2020   TRIG 96 07/10/2020   CHOLHDL 2.7 07/10/2020   Needs to reduce fat intake

## 2020-07-20 NOTE — Assessment & Plan Note (Signed)
Uncontrolled, add spironolactone and re eval in 8 weeks DASH diet and commitment to daily physical activity for a minimum of 30 minutes discussed and encouraged, as a part of hypertension management. The importance of attaining a healthy weight is also discussed.  BP/Weight 07/15/2020 05/27/2020 02/26/2020 01/01/2020 11/19/2019 06/07/2019 05/30/3974  Systolic BP 734 - - 193 790 240 973  Diastolic BP 94 - - 94 84 84 84  Wt. (Lbs) 186 180 180 189 189.12 194 194  BMI 32.95 32.4 32.4 34.02 34.04 34.37 34.37

## 2020-07-20 NOTE — Assessment & Plan Note (Signed)
  Patient re-educated about  the importance of commitment to a  minimum of 150 minutes of exercise per week as able.  The importance of healthy food choices with portion control discussed, as well as eating regularly and within a 12 hour window most days. The need to choose "clean , green" food 50 to 75% of the time is discussed, as well as to make water the primary drink and set a goal of 64 ounces water daily.    Weight /BMI 07/15/2020 05/27/2020 02/26/2020  WEIGHT 186 lb 180 lb 180 lb  HEIGHT 5\' 3"  5' 2.5" 5' 2.5"  BMI 32.95 kg/m2 32.4 kg/m2 32.4 kg/m2

## 2020-07-20 NOTE — Progress Notes (Signed)
   Allison [name]     MRN: [id number]      DOB: Aug 31, 1940   HPI Allison [name] is here for follow up and re-evaluation of chronic medical conditions, medication management and review of any available recent lab and radiology data.  Preventive health is updated, specifically  Cancer screening and Immunization.   Questions or concerns regarding consultations or procedures which the PT has had in the interim are  addressed. The PT denies any adverse reactions to current medications since the last visit.  There are no new concerns.  There are no specific complaints   ROS Denies recent fever or chills. Denies sinus pressure, nasal congestion, ear pain or sore throat. Denies chest congestion, productive cough or wheezing. Denies chest pains, palpitations and leg swelling Denies abdominal pain, nausea, vomiting,diarrhea or constipation.   Denies dysuria, frequency, hesitancy or incontinence. Denies joint pain, swelling and limitation in mobility. Denies headaches, seizures, numbness, or tingling. Denies depression, anxiety or insomnia. Denies skin break down or rash.   PE  BP (!) 142/94   Pulse 67   Resp 16   Ht 5\' 3"  (1.6 m)   Wt 186 lb (84.4 kg)   SpO2 95%   BMI 32.95 kg/m   Patient alert and oriented and in no cardiopulmonary distress.  HEENT: No facial asymmetry, EOMI,     Neck supple .  Chest: Clear to auscultation bilaterally.  CVS: S1, S2 no murmurs, no S3.Regular rate.  ABD: Soft non tender.   Ext: No edema  MS: Adequate ROM spine, shoulders, hips and knees.  Skin: Intact, no ulcerations or rash noted.  Psych: Good eye contact, normal affect. Memory intact not anxious or depressed appearing.  CNS: CN 2-12 intact, power,  normal throughout.no focal deficits noted.   Assessment & Plan Essential hypertension Uncontrolled, add spironolactone and re eval in 8 weeks DASH diet and commitment to daily physical activity for a minimum of 30 minutes discussed and  encouraged, as a part of hypertension management. The importance of attaining a healthy weight is also discussed.  BP/Weight 07/15/2020 05/27/2020 02/26/2020 01/01/2020 11/19/2019 06/07/2019 2/92/4462  Systolic BP 863 - - 817 711 657 903  Diastolic BP 94 - - 94 84 84 84  Wt. (Lbs) 186 180 180 189 189.12 194 194  BMI 32.95 32.4 32.4 34.02 34.04 34.37 34.37       Obesity  Patient re-educated about  the importance of commitment to a  minimum of 150 minutes of exercise per week as able.  The importance of healthy food choices with portion control discussed, as well as eating regularly and within a 12 hour window most days. The need to choose "clean , green" food 50 to 75% of the time is discussed, as well as to make water the primary drink and set a goal of 64 ounces water daily.    Weight /BMI 07/15/2020 05/27/2020 02/26/2020  WEIGHT 186 lb 180 lb 180 lb  HEIGHT 5\' 3"  5' 2.5" 5' 2.5"  BMI 32.95 kg/m2 32.4 kg/m2 32.4 kg/m2      Hyperlipidemia LDL goal <100 Hyperlipidemia:Low fat diet discussed and encouraged.   Lipid Panel  Lab Results  Component Value Date   CHOL 188 07/10/2020   HDL 69 07/10/2020   LDLCALC 102 (H) 07/10/2020   TRIG 96 07/10/2020   CHOLHDL 2.7 07/10/2020   Needs to reduce fat intake

## 2020-08-06 ENCOUNTER — Other Ambulatory Visit: Payer: Self-pay | Admitting: Family Medicine

## 2020-09-23 ENCOUNTER — Ambulatory Visit: Payer: Medicare Other | Admitting: Family Medicine

## 2020-09-26 ENCOUNTER — Other Ambulatory Visit: Payer: Self-pay | Admitting: Family Medicine

## 2020-10-14 ENCOUNTER — Ambulatory Visit (INDEPENDENT_AMBULATORY_CARE_PROVIDER_SITE_OTHER): Payer: Medicare Other | Admitting: Family Medicine

## 2020-10-14 ENCOUNTER — Encounter (INDEPENDENT_AMBULATORY_CARE_PROVIDER_SITE_OTHER): Payer: Self-pay

## 2020-10-14 ENCOUNTER — Encounter: Payer: Self-pay | Admitting: Family Medicine

## 2020-10-14 ENCOUNTER — Other Ambulatory Visit: Payer: Self-pay

## 2020-10-14 VITALS — BP 119/64 | HR 70 | Temp 99.0°F | Resp 18 | Ht 63.0 in | Wt 178.0 lb

## 2020-10-14 DIAGNOSIS — Z1231 Encounter for screening mammogram for malignant neoplasm of breast: Secondary | ICD-10-CM | POA: Diagnosis not present

## 2020-10-14 DIAGNOSIS — Z6833 Body mass index (BMI) 33.0-33.9, adult: Secondary | ICD-10-CM

## 2020-10-14 DIAGNOSIS — E785 Hyperlipidemia, unspecified: Secondary | ICD-10-CM

## 2020-10-14 DIAGNOSIS — I10 Essential (primary) hypertension: Secondary | ICD-10-CM

## 2020-10-14 DIAGNOSIS — Z23 Encounter for immunization: Secondary | ICD-10-CM | POA: Diagnosis not present

## 2020-10-14 DIAGNOSIS — E6609 Other obesity due to excess calories: Secondary | ICD-10-CM

## 2020-10-14 NOTE — Patient Instructions (Addendum)
Annual exam wih mD end October, call if you need me sooner  Flu vaccine today  Please schedule mammogram at checkout  It is important that you exercise regularly at least 30 minutes 5 times a week. If you develop chest pain, have severe difficulty breathing, or feel very tired, stop exercising immediately and seek medical attention  Think about what you will eat, plan ahead. Choose " clean, green, fresh or frozen" over canned, processed or packaged foods which are more sugary, salty and fatty. 70 to 75% of food eaten should be vegetables and fruit. Three meals at set times with snacks allowed between meals, but they must be fruit or vegetables. Aim to eat over a 12 hour period , example 7 am to 7 pm, and STOP after  your last meal of the day. Drink water,generally about 64 ounces per day, no other drink is as healthy. Fruit juice is best enjoyed in a healthy way, by EATING the fruit. Thanks for choosing [facility], we consider it a privelige to serve you.

## 2020-10-15 ENCOUNTER — Encounter: Payer: Self-pay | Admitting: Family Medicine

## 2020-10-15 NOTE — Assessment & Plan Note (Signed)
Improved  Patient re-educated about  the importance of commitment to a  minimum of 150 minutes of exercise per week as able.  The importance of healthy food choices with portion control discussed, as well as eating regularly and within a 12 hour window most days. The need to choose "clean , green" food 50 to 75% of the time is discussed, as well as to make water the primary drink and set a goal of 64 ounces water daily.    Weight /BMI 10/14/2020 07/15/2020 05/27/2020  WEIGHT 178 lb 186 lb 180 lb  HEIGHT 5\' 3"  5\' 3"  5' 2.5"  BMI 31.53 kg/m2 32.95 kg/m2 32.4 kg/m2

## 2020-10-15 NOTE — Assessment & Plan Note (Signed)
Hyperlipidemia:Low fat diet discussed and encouraged.   Lipid Panel  Lab Results  Component Value Date   CHOL 188 07/10/2020   HDL 69 07/10/2020   LDLCALC 102 (H) 07/10/2020   TRIG 96 07/10/2020   CHOLHDL 2.7 07/10/2020     Needs to reduce fat in diet

## 2020-10-15 NOTE — Assessment & Plan Note (Signed)
Controlled, no change in medication DASH diet and commitment to daily physical activity for a minimum of 30 minutes discussed and encouraged, as a part of hypertension management. The importance of attaining a healthy weight is also discussed.  BP/Weight 10/14/2020 07/15/2020 05/27/2020 02/26/2020 01/01/2020 11/19/2019 1/67/4255  Systolic BP 258 948 - - 347 583 074  Diastolic BP 64 94 - - 94 84 84  Wt. (Lbs) 178 186 180 180 189 189.12 194  BMI 31.53 32.95 32.4 32.4 34.02 34.04 34.37

## 2020-10-15 NOTE — Progress Notes (Signed)
   Allison [name]     MRN: [id number]      DOB: May 29, 1940   HPI [name] is here for follow up and re-evaluation of chronic medical conditions, medication management and review of any available recent lab and radiology data.  Preventive health is updated, specifically  Cancer screening and Immunization.   Questions or concerns regarding consultations or procedures which the PT has had in the interim are  addressed. The PT denies any adverse reactions to current medications since the last visit.  There are no new concerns.  There are no specific complaints   ROS Denies recent fever or chills. Denies sinus pressure, nasal congestion, ear pain or sore throat. Denies chest congestion, productive cough or wheezing. Denies chest pains, palpitations and leg swelling Denies abdominal pain, nausea, vomiting,diarrhea or constipation.   Denies dysuria, frequency, hesitancy or incontinence. Denies joint pain, swelling and limitation in mobility. Denies headaches, seizures, numbness, or tingling. Denies depression, anxiety or insomnia. Denies skin break down or rash.   PE  BP 119/64 (BP Location: Right Arm, Patient Position: Sitting, Cuff Size: Large)   Pulse 70   Temp 99 F (37.2 C)   Resp 18   Ht 5\' 3"  (1.6 m)   Wt 178 lb (80.7 kg)   SpO2 98%   BMI 31.53 kg/m   Patient alert and oriented and in no cardiopulmonary distress.  HEENT: No facial asymmetry, EOMI,     Neck supple .  Chest: Clear to auscultation bilaterally.  CVS: S1, S2 no murmurs, no S3.Regular rate.  ABD: Soft non tender.   Ext: No edema  MS: Adequate ROM spine, shoulders, hips and knees.  Skin: Intact, no ulcerations or rash noted.  Psych: Good eye contact, normal affect. Memory intact not anxious or depressed appearing.  CNS: CN 2-12 intact, power,  normal throughout.no focal deficits noted.   Assessment & Plan Essential hypertension Controlled, no change in medication DASH diet and commitment to  daily physical activity for a minimum of 30 minutes discussed and encouraged, as a part of hypertension management. The importance of attaining a healthy weight is also discussed.  BP/Weight 10/14/2020 07/15/2020 05/27/2020 02/26/2020 01/01/2020 11/19/2019 0/03/7046  Systolic BP 889 169 - - 450 388 828  Diastolic BP 64 94 - - 94 84 84  Wt. (Lbs) 178 186 180 180 189 189.12 194  BMI 31.53 32.95 32.4 32.4 34.02 34.04 34.37       Obesity Improved  Patient re-educated about  the importance of commitment to a  minimum of 150 minutes of exercise per week as able.  The importance of healthy food choices with portion control discussed, as well as eating regularly and within a 12 hour window most days. The need to choose "clean , green" food 50 to 75% of the time is discussed, as well as to make water the primary drink and set a goal of 64 ounces water daily.    Weight /BMI 10/14/2020 07/15/2020 05/27/2020  WEIGHT 178 lb 186 lb 180 lb  HEIGHT 5\' 3"  5\' 3"  5' 2.5"  BMI 31.53 kg/m2 32.95 kg/m2 32.4 kg/m2      Hyperlipidemia LDL goal <100 Hyperlipidemia:Low fat diet discussed and encouraged.   Lipid Panel  Lab Results  Component Value Date   CHOL 188 07/10/2020   HDL 69 07/10/2020   LDLCALC 102 (H) 07/10/2020   TRIG 96 07/10/2020   CHOLHDL 2.7 07/10/2020     Needs to reduce fat in diet

## 2020-10-21 ENCOUNTER — Other Ambulatory Visit: Payer: Self-pay | Admitting: Family Medicine

## 2020-10-23 ENCOUNTER — Ambulatory Visit (HOSPITAL_COMMUNITY)
Admission: RE | Admit: 2020-10-23 | Discharge: 2020-10-23 | Disposition: A | Discharge status: Home / Self Care | Payer: Medicare Other | Source: Ambulatory Visit | Attending: Family Medicine | Admitting: Family Medicine

## 2020-10-23 ENCOUNTER — Other Ambulatory Visit: Payer: Self-pay

## 2020-10-23 DIAGNOSIS — Z1231 Encounter for screening mammogram for malignant neoplasm of breast: Secondary | ICD-10-CM | POA: Insufficient documentation

## 2020-11-13 ENCOUNTER — Other Ambulatory Visit: Payer: Self-pay | Admitting: Family Medicine

## 2020-12-04 ENCOUNTER — Encounter: Payer: Self-pay | Admitting: Family Medicine

## 2020-12-04 ENCOUNTER — Ambulatory Visit (INDEPENDENT_AMBULATORY_CARE_PROVIDER_SITE_OTHER): Payer: Medicare Other | Admitting: Family Medicine

## 2020-12-04 ENCOUNTER — Other Ambulatory Visit: Payer: Self-pay

## 2020-12-04 VITALS — BP 132/82 | HR 69 | Resp 16 | Ht 63.0 in | Wt 179.4 lb

## 2020-12-04 DIAGNOSIS — Z1211 Encounter for screening for malignant neoplasm of colon: Secondary | ICD-10-CM

## 2020-12-04 DIAGNOSIS — Z0000 Encounter for general adult medical examination without abnormal findings: Secondary | ICD-10-CM

## 2020-12-04 MED ORDER — AMLODIPINE BESYLATE 5 MG PO TABS: 5.0000 mg | ORAL_TABLET | Freq: Every day | ORAL | 3 refills | Status: DC

## 2020-12-04 MED ORDER — SPIRONOLACTONE 25 MG PO TABS: 25.0000 mg | ORAL_TABLET | Freq: Every day | ORAL | 3 refills | Status: DC

## 2020-12-04 NOTE — Patient Instructions (Signed)
F/U in 5 months, call if you need me before  Good exam and labs  Please arrange cologuard testing for patient  You are very healthy and doing well, you do not need a handicap sticker  Thanks for choosing  [facility], we consider it a privelige to serve you.

## 2020-12-04 NOTE — Progress Notes (Signed)
    Allison [name]     MRN: [id number]      DOB: 11-10-1940  HPI: Patient is in for annual physical exam. No other health concerns are expressed or addressed at the visit. Recent labs,  are reviewed. Immunization is reviewed , and  updated if needed.   PE: BP 132/82   Pulse 69   Resp 16   Ht 5\' 3"  (1.6 m)   Wt 179 lb 6.4 oz (81.4 kg)   SpO2 96%   BMI 31.78 kg/m   Pleasant  female, alert and oriented x 3, in no cardio-pulmonary distress. Afebrile. HEENT No facial trauma or asymetry. Sinuses non tender.  Extra occullar muscles intact.. External ears normal, . Neck: supple, no adenopathy,JVD or thyromegaly.No bruits.  Chest: Clear to ascultation bilaterally.No crackles or wheezes. Non tender to palpation    Cardiovascular system; Heart sounds normal,  S1 and  S2 ,no S3.  No murmur, or thrill. Apical beat not displaced Peripheral pulses normal.  Abdomen: Soft, non tender, no organomegaly or masses. No bruits. Bowel sounds normal. No guarding, tenderness or rebound.   .   Musculoskeletal exam: Full ROM of spine, hips , shoulders and knees. No deformity ,swelling or crepitus noted. No muscle wasting or atrophy.   Neurologic: Cranial nerves 2 to 12 intact. Power, tone ,sensation and reflexes normal throughout. No disturbance in gait. No tremor.  Skin: Intact, no ulceration, erythema , scaling or rash noted. Pigmentation normal throughout  Psych; Normal mood and affect. Judgement and concentration normal   Assessment & Plan:  Annual physical exam Annual exam as documented. Counseling done  re healthy lifestyle involving commitment to 150 minutes exercise per week, heart healthy diet, and attaining healthy weight.The importance of adequate sleep also discussed. Regular seat belt use and home safety, is also discussed. Changes in health habits are decided on by the patient with goals and time frames  set for achieving them. Immunization and cancer  screening needs are specifically addressed at this visit.

## 2020-12-05 ENCOUNTER — Encounter: Payer: Medicare Other | Admitting: Family Medicine

## 2020-12-06 NOTE — Assessment & Plan Note (Signed)

## 2020-12-10 DIAGNOSIS — Z1211 Encounter for screening for malignant neoplasm of colon: Secondary | ICD-10-CM | POA: Diagnosis not present

## 2020-12-16 LAB — COLOGUARD: COLOGUARD: POSITIVE — AB

## 2020-12-17 ENCOUNTER — Other Ambulatory Visit: Payer: Self-pay | Admitting: Family Medicine

## 2020-12-17 DIAGNOSIS — R195 Other fecal abnormalities: Secondary | ICD-10-CM
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
## 2020-12-22 ENCOUNTER — Encounter: Payer: Self-pay | Admitting: Internal Medicine

## 2020-12-31 ENCOUNTER — Other Ambulatory Visit: Payer: Self-pay

## 2020-12-31 ENCOUNTER — Encounter: Payer: Self-pay | Admitting: Gastroenterology

## 2020-12-31 ENCOUNTER — Ambulatory Visit: Payer: Medicare Other | Admitting: Gastroenterology

## 2020-12-31 ENCOUNTER — Telehealth: Payer: Self-pay

## 2020-12-31 DIAGNOSIS — R195 Other fecal abnormalities: Secondary | ICD-10-CM | POA: Diagnosis not present

## 2020-12-31 MED ORDER — CLENPIQ 10-3.5-12 MG-GM -GM/160ML PO SOLN: 1.0000 | Freq: Once | ORAL | 0 refills | Status: AC

## 2020-12-31 NOTE — Telephone Encounter (Signed)
PA for TCS submitted via [facility] website. PA# [id number], valid 01/20/21-01/24/21.
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
## 2020-12-31 NOTE — Patient Instructions (Signed)
Colonoscopy as scheduled. See separate instructions.  
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
## 2021-01-15 ENCOUNTER — Other Ambulatory Visit: Payer: Self-pay

## 2021-01-15 ENCOUNTER — Other Ambulatory Visit (HOSPITAL_COMMUNITY)
Admission: RE | Admit: 2021-01-15 | Discharge: 2021-01-15 | Disposition: A | Discharge status: Home / Self Care | Payer: Medicare Other | Source: Ambulatory Visit | Attending: Internal Medicine | Admitting: Internal Medicine

## 2021-01-15 DIAGNOSIS — R195 Other fecal abnormalities: Secondary | ICD-10-CM | POA: Insufficient documentation

## 2021-01-15 LAB — BASIC METABOLIC PANEL
Anion gap: 7 (ref 5–15)
BUN: 14 mg/dL (ref 8–23)
CO2: 27 mmol/L (ref 22–32)
Calcium: 9.2 mg/dL (ref 8.9–10.3)
Chloride: 104 mmol/L (ref 98–111)
Creatinine, Ser: 0.9 mg/dL (ref 0.44–1.00)
GFR, Estimated: >60 mL/min (ref >60)
Glucose, Bld: 100 mg/dL — ABNORMAL HIGH (ref 70–99)
Potassium: 3.9 mmol/L (ref 3.5–5.1)
Sodium: 138 mmol/L (ref 135–145)

## 2021-01-20 ENCOUNTER — Ambulatory Visit (HOSPITAL_COMMUNITY)
Admission: RE | Admit: 2021-01-20 | Discharge: 2021-01-20 | Disposition: A | Discharge status: Home / Self Care | Payer: Medicare Other | Attending: Internal Medicine | Admitting: Internal Medicine

## 2021-01-20 ENCOUNTER — Ambulatory Visit (HOSPITAL_COMMUNITY): Payer: Medicare Other | Admitting: Certified Registered"

## 2021-01-20 ENCOUNTER — Encounter (HOSPITAL_COMMUNITY): Payer: Self-pay

## 2021-01-20 ENCOUNTER — Other Ambulatory Visit: Payer: Self-pay

## 2021-01-20 ENCOUNTER — Encounter (HOSPITAL_COMMUNITY)
Admission: RE | Disposition: A | Discharge status: Home / Self Care | Payer: Self-pay | Source: Home / Self Care | Attending: Internal Medicine

## 2021-01-20 DIAGNOSIS — K648 Other hemorrhoids: Secondary | ICD-10-CM | POA: Diagnosis not present

## 2021-01-20 DIAGNOSIS — D125 Benign neoplasm of sigmoid colon: Secondary | ICD-10-CM

## 2021-01-20 DIAGNOSIS — K5900 Constipation, unspecified: Secondary | ICD-10-CM | POA: Insufficient documentation

## 2021-01-20 DIAGNOSIS — K219 Gastro-esophageal reflux disease without esophagitis: Secondary | ICD-10-CM | POA: Diagnosis not present

## 2021-01-20 DIAGNOSIS — K5730 Diverticulosis of large intestine without perforation or abscess without bleeding: Secondary | ICD-10-CM | POA: Diagnosis not present

## 2021-01-20 DIAGNOSIS — D124 Benign neoplasm of descending colon: Secondary | ICD-10-CM

## 2021-01-20 DIAGNOSIS — K635 Polyp of colon: Secondary | ICD-10-CM | POA: Diagnosis not present

## 2021-01-20 DIAGNOSIS — I10 Essential (primary) hypertension: Secondary | ICD-10-CM | POA: Insufficient documentation

## 2021-01-20 DIAGNOSIS — R195 Other fecal abnormalities: Secondary | ICD-10-CM | POA: Diagnosis not present

## 2021-01-20 HISTORY — PX: POLYPECTOMY: SHX5525

## 2021-01-20 HISTORY — PX: COLONOSCOPY WITH PROPOFOL: SHX5780

## 2021-01-20 SURGERY — COLONOSCOPY WITH PROPOFOL
Anesthesia: General

## 2021-01-20 MED ORDER — LIDOCAINE HCL (CARDIAC) PF 100 MG/5ML IV SOSY
PREFILLED_SYRINGE | INTRAVENOUS | Status: DC | PRN
  Administered 2021-01-20: 50 mg via INTRAVENOUS

## 2021-01-20 MED ORDER — LACTATED RINGERS IV SOLN
INTRAVENOUS | Status: DC | PRN
Start: 2021-01-20 — End: 2021-01-20

## 2021-01-20 MED ORDER — LACTATED RINGERS IV SOLN: INTRAVENOUS | Status: DC

## 2021-01-20 MED ORDER — PHENYLEPHRINE 40 MCG/ML (10ML) SYRINGE FOR IV PUSH (FOR BLOOD PRESSURE SUPPORT)
PREFILLED_SYRINGE | INTRAVENOUS | Status: AC
  Filled 2021-01-20: qty 10

## 2021-01-20 MED ORDER — PROPOFOL 10 MG/ML IV BOLUS
INTRAVENOUS | Status: DC | PRN
  Administered 2021-01-20: 100 mg via INTRAVENOUS
  Administered 2021-01-20 (×2): 30 mg via INTRAVENOUS

## 2021-01-20 MED ORDER — PHENYLEPHRINE 40 MCG/ML (10ML) SYRINGE FOR IV PUSH (FOR BLOOD PRESSURE SUPPORT)
PREFILLED_SYRINGE | INTRAVENOUS | Status: DC | PRN
  Administered 2021-01-20 (×2): 80 ug via INTRAVENOUS

## 2021-01-20 NOTE — Anesthesia Preprocedure Evaluation (Signed)
Anesthesia Evaluation  Patient identified by MRN, date of birth, ID band Patient awake    Reviewed: Allergy & Precautions, H&P , NPO status , Patient's Chart, lab work & pertinent test results, reviewed documented beta blocker date and time   Airway Mallampati: II  TM Distance: >3 FB Neck ROM: full    Dental no notable dental hx.    Pulmonary neg pulmonary ROS,    Pulmonary exam normal breath sounds clear to auscultation       Cardiovascular Exercise Tolerance: Good hypertension, negative cardio ROS   Rhythm:regular Rate:Normal     Neuro/Psych negative neurological ROS  negative psych ROS   GI/Hepatic Neg liver ROS, GERD  Medicated,  Endo/Other  negative endocrine ROS  Renal/GU negative Renal ROS  negative genitourinary   Musculoskeletal   Abdominal   Peds  Hematology negative hematology ROS (+)   Anesthesia Other Findings   Reproductive/Obstetrics negative OB ROS                             Anesthesia Physical Anesthesia Plan  ASA: 2  Anesthesia Plan: General   Post-op Pain Management:    Induction:   PONV Risk Score and Plan: Propofol infusion  Airway Management Planned:   Additional Equipment:   Intra-op Plan:   Post-operative Plan:   Informed Consent: I have reviewed the patients History and Physical, chart, labs and discussed the procedure including the risks, benefits and alternatives for the proposed anesthesia with the patient or authorized representative who has indicated his/her understanding and acceptance.     Dental Advisory Given  Plan Discussed with: CRNA  Anesthesia Plan Comments:         Anesthesia Quick Evaluation  

## 2021-01-20 NOTE — Discharge Instructions (Addendum)
°  Colonoscopy Discharge Instructions  Read the instructions outlined below and refer to this sheet in the next few weeks. These discharge instructions provide you with general information on caring for yourself after you leave the hospital. Your doctor may also give you specific instructions. While your treatment has been planned according to the most current medical practices available, unavoidable complications occasionally occur.   ACTIVITY You may resume your regular activity, but move at a slower pace for the next 24 hours.  Take frequent rest periods for the next 24 hours.  Walking will help get rid of the air and reduce the bloated feeling in your belly (abdomen).  No driving for 24 hours (because of the medicine (anesthesia) used during the test).   Do not sign any important legal documents or operate any machinery for 24 hours (because of the anesthesia used during the test).  NUTRITION Drink plenty of fluids.  You may resume your normal diet as instructed by your doctor.  Begin with a light meal and progress to your normal diet. Heavy or fried foods are harder to digest and may make you feel sick to your stomach (nauseated).  Avoid alcoholic beverages for 24 hours or as instructed.  MEDICATIONS You may resume your normal medications unless your doctor tells you otherwise.  WHAT YOU CAN EXPECT TODAY Some feelings of bloating in the abdomen.  Passage of more gas than usual.  Spotting of blood in your stool or on the toilet paper.  IF YOU HAD POLYPS REMOVED DURING THE COLONOSCOPY: No aspirin products for 7 days or as instructed.  No alcohol for 7 days or as instructed.  Eat a soft diet for the next 24 hours.  FINDING OUT THE RESULTS OF YOUR TEST Not all test results are available during your visit. If your test results are not back during the visit, make an appointment with your caregiver to find out the results. Do not assume everything is normal if you have not heard from your  caregiver or the medical facility. It is important for you to follow up on all of your test results.  SEEK IMMEDIATE MEDICAL ATTENTION IF: You have more than a spotting of blood in your stool.  Your belly is swollen (abdominal distention).  You are nauseated or vomiting.  You have a temperature over 101.  You have abdominal pain or discomfort that is severe or gets worse throughout the day.   Your colonoscopy revealed 2 polyp(s) which I removed successfully. Await pathology results, my office will contact you. You also have diverticulosis and internal hemorrhoids. I would recommend increasing fiber in your diet or adding OTC Benefiber/Metamucil. Be sure to drink at least 4 to 6 glasses of water daily. Follow-up with GI as needed. We can discuss risks vs benefits of repeating colonoscopy in 5 years given your polyps today.    I hope you have a great rest of your week!  Elon Alas. Abbey Chatters, D.O. Gastroenterology and Hepatology [facility]

## 2021-01-20 NOTE — Anesthesia Procedure Notes (Signed)
Date/Time: 01/20/2021 12:03 PM Performed by: Orlie Dakin, CRNA Pre-anesthesia Checklist: Patient identified, Emergency Drugs available, Suction available and Patient being monitored Patient Re-evaluated:Patient Re-evaluated prior to induction Oxygen Delivery Method: Nasal cannula Induction Type: IV induction Placement Confirmation: positive ETCO2

## 2021-01-20 NOTE — Interval H&P Note (Signed)
History and Physical Interval Note:  01/20/2021 11:21 AM  Allison [name]  has presented today for surgery, with the diagnosis of positive cologuard.  The various methods of treatment have been discussed with the patient and family. After consideration of risks, benefits and other options for treatment, the patient has consented to  Procedure(s) with comments: COLONOSCOPY WITH PROPOFOL (N/A) - 1:00pm as a surgical intervention.  The patient's history has been reviewed, patient examined, no change in status, stable for surgery.  I have reviewed the patient's chart and labs.  Questions were answered to the patient's satisfaction.     Eloise Harman

## 2021-01-20 NOTE — Anesthesia Postprocedure Evaluation (Signed)
Anesthesia Post Note  Patient: Allison [name]  Procedure(s) Performed: COLONOSCOPY WITH PROPOFOL POLYPECTOMY  Patient location during evaluation: Phase II Anesthesia Type: General Level of consciousness: awake Pain management: pain level controlled Vital Signs Assessment: post-procedure vital signs reviewed and stable Respiratory status: spontaneous breathing and respiratory function stable Cardiovascular status: blood pressure returned to baseline and stable Postop Assessment: no headache and no apparent nausea or vomiting Anesthetic complications: no Comments: Late entry   No notable events documented.   Last Vitals:  Vitals:   01/20/21 1135 01/20/21 1213  BP: (!) 161/97 139/63  Pulse: 78   Resp: 15 19  Temp: 36.6 C 36.4 C  SpO2: 100% 98%    Last Pain:  Vitals:   01/20/21 1213  TempSrc: Oral  PainSc: 0-No pain                 Louann Sjogren

## 2021-01-20 NOTE — Transfer of Care (Signed)
Immediate Anesthesia Transfer of Care Note  Patient: Allison [name]  Procedure(s) Performed: COLONOSCOPY WITH PROPOFOL POLYPECTOMY  Patient Location: Endoscopy Unit  Anesthesia Type:General  Level of Consciousness: awake  Airway & Oxygen Therapy: Patient Spontanous Breathing  Post-op Assessment: Report given to RN and Post -op Vital signs reviewed and stable  Post vital signs: Reviewed and stable  Last Vitals:  Vitals Value Taken Time  BP 139/63 01/20/21 1213  Temp 36.4 C 01/20/21 1213  Pulse    Resp 19 01/20/21 1213  SpO2 98 % 01/20/21 1213    Last Pain:  Vitals:   01/20/21 1213  TempSrc: Oral  PainSc: 0-No pain         Complications: No notable events documented.

## 2021-01-20 NOTE — Op Note (Signed)
[facility] Patient Name: Allison [name] Procedure Date: 01/20/2021 11:23 AM MRN: [id number] Date of Birth: 12-16-1940 Attending MD: Elon Alas. Abbey Chatters DO CSN: [id number] Age: 80 Admit Type: Outpatient Procedure:                Colonoscopy Indications:              Positive Cologuard test Providers:                Elon Alas. Abbey Chatters, DO, Lambert Mody, Aram Candela Referring MD:              Medicines:                See the Anesthesia note for documentation of the                            administered medications Complications:            No immediate complications. Estimated Blood Loss:     Estimated blood loss was minimal. Procedure:                Pre-Anesthesia Assessment:                           - The anesthesia plan was to use monitored                            anesthesia care (MAC).                           After obtaining informed consent, the colonoscope                            was passed under direct vision. Throughout the                            procedure, the patient's blood pressure, pulse, and                            oxygen saturations were monitored continuously. The                            [id number]-HQ190L ([id number]) scope was introduced through                            the anus and advanced to the the cecum, identified                            by appendiceal orifice and ileocecal valve. The                            colonoscopy was performed without difficulty. The                            patient tolerated the procedure well. The quality  of the bowel preparation was evaluated using the                            BBPS [facility] Bowel Preparation Scale) with scores                            of: Right Colon = 3, Transverse Colon = 3 and Left                            Colon = 3 (entire mucosa seen well with no residual                            staining, small fragments of stool or  opaque                            liquid). The total BBPS score equals 9. Scope In: 11:58:48 AM Scope Out: 12:09:57 PM Scope Withdrawal Time: 0 hours 8 minutes 16 seconds  Total Procedure Duration: 0 hours 11 minutes 9 seconds  Findings:      The perianal and digital rectal examinations were normal.      Non-bleeding internal hemorrhoids were found during endoscopy.      Multiple small and large-mouthed diverticula were found in the sigmoid       colon and descending colon.      A 8 mm polyp was found in the descending colon. The polyp was       pedunculated. The polyp was removed with a cold snare. Resection and       retrieval were complete.      A 6 mm polyp was found in the sigmoid colon. The polyp was flat. The       polyp was removed with a cold snare. Resection and retrieval were       complete.      The exam was otherwise without abnormality. Impression:               - Non-bleeding internal hemorrhoids.                           - Diverticulosis in the sigmoid colon and in the                            descending colon.                           - One 8 mm polyp in the descending colon, removed                            with a cold snare. Resected and retrieved.                           - One 6 mm polyp in the sigmoid colon, removed with                            a cold snare. Resected and retrieved.                           -  The examination was otherwise normal. Moderate Sedation:      Per Anesthesia Care Recommendation:           - Patient has a contact number available for                            emergencies. The signs and symptoms of potential                            delayed complications were discussed with the                            patient. Return to normal activities tomorrow.                            Written discharge instructions were provided to the                            patient.                           - Resume previous diet.                            - Continue present medications.                           - Await pathology results.                           - No repeat colonoscopy due to age.                           - Return to GI clinic PRN. Procedure Code(s):        --- Professional ---                           [phone], Colonoscopy, flexible; with removal of                            tumor(s), polyp(s), or other lesion(s) by snare                            technique Diagnosis Code(s):        --- Professional ---                           K64.8, Other hemorrhoids                           K63.5, Polyp of colon                           R19.5, Other fecal abnormalities                           K57.30, Diverticulosis of large intestine without  perforation or abscess without bleeding CPT copyright 2019 American [employer] Association. All rights reserved. The codes documented in this report are preliminary and upon coder review may  be revised to meet current compliance requirements. Elon Alas. Abbey Chatters, DO [place] Abbey Chatters, DO 01/20/2021 12:15:03 PM This report has been signed electronically. Number of Addenda: 0

## 2021-01-21 LAB — SURGICAL PATHOLOGY

## 2021-01-22 ENCOUNTER — Encounter (HOSPITAL_COMMUNITY): Payer: Self-pay | Admitting: Internal Medicine

## 2021-02-11 ENCOUNTER — Other Ambulatory Visit: Payer: Self-pay | Admitting: Family Medicine

## 2021-02-17 DIAGNOSIS — H52203 Unspecified astigmatism, bilateral: Secondary | ICD-10-CM | POA: Diagnosis not present

## 2021-02-17 DIAGNOSIS — H524 Presbyopia: Secondary | ICD-10-CM | POA: Diagnosis not present

## 2021-02-17 DIAGNOSIS — H5203 Hypermetropia, bilateral: Secondary | ICD-10-CM | POA: Diagnosis not present

## 2021-02-17 DIAGNOSIS — Z961 Presence of intraocular lens: Secondary | ICD-10-CM | POA: Diagnosis not present

## 2021-04-27 ENCOUNTER — Ambulatory Visit: Payer: Medicare Other | Admitting: Gastroenterology

## 2021-05-05 ENCOUNTER — Ambulatory Visit (INDEPENDENT_AMBULATORY_CARE_PROVIDER_SITE_OTHER): Payer: Medicare Other | Admitting: Family Medicine

## 2021-05-05 ENCOUNTER — Encounter: Payer: Self-pay | Admitting: Family Medicine

## 2021-05-05 VITALS — BP 126/80 | HR 76 | Ht 62.0 in | Wt 177.0 lb

## 2021-05-05 DIAGNOSIS — G4709 Other insomnia: Secondary | ICD-10-CM | POA: Diagnosis not present

## 2021-05-05 DIAGNOSIS — Z1231 Encounter for screening mammogram for malignant neoplasm of breast: Secondary | ICD-10-CM | POA: Diagnosis not present

## 2021-05-05 DIAGNOSIS — E559 Vitamin D deficiency, unspecified: Secondary | ICD-10-CM | POA: Diagnosis not present

## 2021-05-05 DIAGNOSIS — R195 Other fecal abnormalities: Secondary | ICD-10-CM

## 2021-05-05 DIAGNOSIS — R7301 Impaired fasting glucose: Secondary | ICD-10-CM

## 2021-05-05 DIAGNOSIS — I10 Essential (primary) hypertension: Secondary | ICD-10-CM | POA: Diagnosis not present

## 2021-05-05 DIAGNOSIS — E785 Hyperlipidemia, unspecified: Secondary | ICD-10-CM | POA: Diagnosis not present

## 2021-05-05 DIAGNOSIS — E6609 Other obesity due to excess calories: Secondary | ICD-10-CM

## 2021-05-05 DIAGNOSIS — M8580 Other specified disorders of bone density and structure, unspecified site: Secondary | ICD-10-CM

## 2021-05-05 DIAGNOSIS — Z6833 Body mass index (BMI) 33.0-33.9, adult: Secondary | ICD-10-CM

## 2021-05-05 MED ORDER — MELATONIN 3 MG PO TABS: 3.0000 mg | ORAL_TABLET | Freq: Every evening | ORAL | 3 refills | Status: DC | PRN

## 2021-05-05 NOTE — Assessment & Plan Note (Signed)
Improved ? ?Patient re-educated about  the importance of commitment to a  minimum of 150 minutes of exercise per week as able. ? ?The importance of healthy food choices with portion control discussed, as well as eating regularly and within a 12 hour window most days. ?The need to choose "clean , green" food 50 to 75% of the time is discussed, as well as to make water the primary drink and set a goal of 64 ounces water daily. ? ?  ? ?  05/05/2021  ?  8:56 AM 12/31/2020  ?  9:14 AM 12/04/2020  ?  4:03 PM  ?Weight /BMI  ?Weight 177 lb 0.6 oz 178 lb 12.8 oz 179 lb 6.4 oz  ?Height '5\' 2"'$  (1.575 m) '5\' 3"'$  (1.6 m) '5\' 3"'$  (1.6 m)  ?BMI 32.38 kg/m2 31.67 kg/m2 31.78 kg/m2  ? ? ? ?

## 2021-05-05 NOTE — Progress Notes (Signed)
? ? ? ? ? ?Allison [name]     MRN: [id number]      DOB: 12-06-1940 ? ? ?HPI ?Allison [name] is here for follow up and re-evaluation of chronic medical conditions, medication management and review of any available recent lab and radiology data.  ?Preventive health is updated, specifically  Cancer screening and Immunization.   ?Questions or concerns regarding consultations or procedures which the PT has had in the interim are  addressed. ?The PT denies any adverse reactions to current medications since the last visit.  ?Tc/o difficulty sleeping at times when she is worrying about her friend ?ROS ?Denies recent fever or chills. ?Denies sinus pressure, nasal congestion, ear pain or sore throat. ?Denies chest congestion, productive cough or wheezing. ?Denies chest pains, palpitations and leg swelling ?Denies abdominal pain, nausea, vomiting,diarrhea or constipation.   ?Denies dysuria, frequency, hesitancy or incontinence. ?Denies joint pain, swelling and limitation in mobility. ?Denies headaches, seizures, numbness, or tingling. ? ? ?PE ? ?BP 126/80   Pulse 76   Ht '5\' 2"'$  (1.575 m)   Wt 177 lb 0.6 oz (80.3 kg)   SpO2 96%   BMI 32.38 kg/m?  ? ?Patient alert and oriented and in no cardiopulmonary distress. ? ?HEENT: No facial asymmetry, EOMI,     Neck supple . ? ?Chest: Clear to auscultation bilaterally. ? ?CVS: S1, S2 no murmurs, no S3.Regular rate. ? ?ABD: Soft non tender.  ? ?Ext: No edema ? ?MS: Adequate ROM spine, shoulders, hips and knees. ? ?Skin: Intact, no ulcerations or rash noted. ? ?Psych: Good eye contact, normal affect. Memory intact not anxious or depressed appearing. ? ?CNS: CN 2-12 intact, power,  normal throughout.no focal deficits noted. ? ? ?Assessment & Plan ? ?Essential hypertension ?DASH diet and commitment to daily physical activity for a minimum of 30 minutes discussed and encouraged, as a part of hypertension management. ?The importance of attaining a healthy weight is also discussed. ? ? ?   05/05/2021  ?  8:56 AM 01/20/2021  ? 12:13 PM 01/20/2021  ? 11:35 AM 12/31/2020  ?  9:14 AM 12/04/2020  ?  4:21 PM 12/04/2020  ?  4:03 PM 10/14/2020  ?  3:08 PM  ?BP/Weight  ?Systolic BP 629 528 413 244 132 153 119  ?Diastolic BP 80 63 97 91 82 87 64  ?Wt. (Lbs) 177.04   178.8  179.4 178  ?BMI 32.38 kg/m2   31.67 kg/m2  31.78 kg/m2 31.53 kg/m2  ? ? ? ?Controlled, no change in medication ? ? ?Hyperlipidemia LDL goal <100 ?Hyperlipidemia:Low fat diet discussed and encouraged. ? ? ?Lipid Panel  ?Lab Results  ?Component Value Date  ? CHOL 188 07/10/2020  ? HDL 69 07/10/2020  ? LDLCALC 102 (H) 07/10/2020  ? TRIG 96 07/10/2020  ? CHOLHDL 2.7 07/10/2020  ? ? ? ?Updated lab needed  ? ?Obesity ?Improved ? ?Patient re-educated about  the importance of commitment to a  minimum of 150 minutes of exercise per week as able. ? ?The importance of healthy food choices with portion control discussed, as well as eating regularly and within a 12 hour window most days. ?The need to choose "clean , green" food 50 to 75% of the time is discussed, as well as to make water the primary drink and set a goal of 64 ounces water daily. ? ?  ? ?  05/05/2021  ?  8:56 AM 12/31/2020  ?  9:14 AM 12/04/2020  ?  4:03 PM  ?Weight /BMI  ?  Weight 177 lb 0.6 oz 178 lb 12.8 oz 179 lb 6.4 oz  ?Height '5\' 2"'$  (1.575 m) '5\' 3"'$  (1.6 m) '5\' 3"'$  (1.6 m)  ?BMI 32.38 kg/m2 31.67 kg/m2 31.78 kg/m2  ? ? ? ? ?Vitamin D deficiency ?Updated lab needed at/ before next visit. ? ? ?Osteopenia ?Needs to re commit to exercise ? ?Impaired fasting glucose ?Patient educated about the importance of limiting  Carbohydrate intake , the need to commit to daily physical activity for a minimum of 30 minutes , and to commit weight loss. ?The fact that changes in all these areas will reduce or eliminate all together the development of diabetes is stressed.  ? ? ?  Latest Ref Rng & Units 01/15/2021  ? 10:05 AM 07/10/2020  ?  9:40 AM 11/14/2019  ?  9:12 AM 05/29/2019  ?  8:49 AM 10/30/2018  ? 11:31 AM   ?Diabetic Labs  ?HbA1c <5.7 % of total Hgb   5.3   5.0     ?Chol 100 - 199 mg/dL  188   185   185   176    ?HDL >39 mg/dL  69   70   58   65    ?Calc LDL 0 - 99 mg/dL  102   96   107   93    ?Triglycerides 0 - 149 mg/dL  96   101   103   87    ?Creatinine 0.44 - 1.00 mg/dL 0.90   0.88   0.89   0.90   0.89    ? ? ?  05/05/2021  ?  8:56 AM 01/20/2021  ? 12:13 PM 01/20/2021  ? 11:35 AM 12/31/2020  ?  9:14 AM 12/04/2020  ?  4:21 PM 12/04/2020  ?  4:03 PM 10/14/2020  ?  3:08 PM  ?BP/Weight  ?Systolic BP 010 071 219 758 132 153 119  ?Diastolic BP 80 63 97 91 82 87 64  ?Wt. (Lbs) 177.04   178.8  179.4 178  ?BMI 32.38 kg/m2   31.67 kg/m2  31.78 kg/m2 31.53 kg/m2  ? ?   ? View : No data to display.  ?  ?  ?  ?Normal hBA1C x 2  ? ? ? ?Insomnia ?Mild and intermittent, associated with excess worry over friend, behavior modification and melatonon 3 mg as needed, also regular exercise commitment and get dog again ? ?

## 2021-05-05 NOTE — Assessment & Plan Note (Signed)
Patient educated about the importance of limiting  Carbohydrate intake , the need to commit to daily physical activity for a minimum of 30 minutes , and to commit weight loss. ?The fact that changes in all these areas will reduce or eliminate all together the development of diabetes is stressed.  ? ? ?  Latest Ref Rng & Units 01/15/2021  ? 10:05 AM 07/10/2020  ?  9:40 AM 11/14/2019  ?  9:12 AM 05/29/2019  ?  8:49 AM 10/30/2018  ? 11:31 AM  ?Diabetic Labs  ?HbA1c <5.7 % of total Hgb   5.3   5.0     ?Chol 100 - 199 mg/dL  188   185   185   176    ?HDL >39 mg/dL  69   70   58   65    ?Calc LDL 0 - 99 mg/dL  102   96   107   93    ?Triglycerides 0 - 149 mg/dL  96   101   103   87    ?Creatinine 0.44 - 1.00 mg/dL 0.90   0.88   0.89   0.90   0.89    ? ? ?  05/05/2021  ?  8:56 AM 01/20/2021  ? 12:13 PM 01/20/2021  ? 11:35 AM 12/31/2020  ?  9:14 AM 12/04/2020  ?  4:21 PM 12/04/2020  ?  4:03 PM 10/14/2020  ?  3:08 PM  ?BP/Weight  ?Systolic BP 397 673 419 379 132 153 119  ?Diastolic BP 80 63 97 91 82 87 64  ?Wt. (Lbs) 177.04   178.8  179.4 178  ?BMI 32.38 kg/m2   31.67 kg/m2  31.78 kg/m2 31.53 kg/m2  ? ?   ? View : No data to display.  ?  ?  ?  ?Normal hBA1C x 2  ? ? ?

## 2021-05-05 NOTE — Assessment & Plan Note (Signed)
Needs to re commit to exercise ?

## 2021-05-05 NOTE — Assessment & Plan Note (Signed)
Updated lab needed at/ before next visit.   

## 2021-05-05 NOTE — Assessment & Plan Note (Signed)
Hyperlipidemia:Low fat diet discussed and encouraged. ? ? ?Lipid Panel  ?Lab Results  ?Component Value Date  ? CHOL 188 07/10/2020  ? HDL 69 07/10/2020  ? LDLCALC 102 (H) 07/10/2020  ? TRIG 96 07/10/2020  ? CHOLHDL 2.7 07/10/2020  ? ? ? ?Updated lab needed  ?

## 2021-05-05 NOTE — Assessment & Plan Note (Signed)
DASH diet and commitment to daily physical activity for a minimum of 30 minutes discussed and encouraged, as a part of hypertension management. ?The importance of attaining a healthy weight is also discussed. ? ? ?  05/05/2021  ?  8:56 AM 01/20/2021  ? 12:13 PM 01/20/2021  ? 11:35 AM 12/31/2020  ?  9:14 AM 12/04/2020  ?  4:21 PM 12/04/2020  ?  4:03 PM 10/14/2020  ?  3:08 PM  ?BP/Weight  ?Systolic BP 825 053 976 734 132 153 119  ?Diastolic BP 80 63 97 91 82 87 64  ?Wt. (Lbs) 177.04   178.8  179.4 178  ?BMI 32.38 kg/m2   31.67 kg/m2  31.78 kg/m2 31.53 kg/m2  ? ? ? ?Controlled, no change in medication ? ?

## 2021-05-05 NOTE — Assessment & Plan Note (Signed)
Mild and intermittent, associated with excess worry over friend, behavior modification and melatonon 3 mg as needed, also regular exercise commitment and get dog again ?

## 2021-05-05 NOTE — Patient Instructions (Addendum)
Annual physical exam with MD 11/12 or after, flu vaccine at visit ? ?Labs needed, today, CBC,  lipid cmp and eGFr, tSH and vit D ? ?Please schedule mammogram at checkout, September,30 or after ? ?Need shingrix vaccines , please get them , starting today , if possible ? ?Please get another dog ? ? ?Take melatonin 3 mg one at bedtime, as needed, on nights when you wake up early unable to sleep ? ?It is important that you exercise regularly at least 30 minutes 5 times a week. If you develop chest pain, have severe difficulty breathing, or feel very tired, stop exercising immediately and seek medical attention  ? ?Think about what you will eat, plan ahead. ?Choose " clean, green, fresh or frozen" over canned, processed or packaged foods which are more sugary, salty and fatty. ?70 to 75% of food eaten should be vegetables and fruit. ?Three meals at set times with snacks allowed between meals, but they must be fruit or vegetables. ?Aim to eat over a 12 hour period , example 7 am to 7 pm, and STOP after  your last meal of the day. ?Drink water,generally about 64 ounces per day, no other drink is as healthy. Fruit juice is best enjoyed in a healthy way, by EATING the fruit. ?Thanks for choosing [facility], we consider it a privelige to serve you. ? ?

## 2021-05-06 LAB — CMP14+EGFR
ALT: 11 IU/L (ref 0–32)
AST: 16 IU/L (ref 0–40)
Albumin/Globulin Ratio: 1.5 (ref 1.2–2.2)
Albumin: 4.5 g/dL (ref 3.6–4.6)
Alkaline Phosphatase: 78 IU/L (ref 44–121)
BUN/Creatinine Ratio: 10 — ABNORMAL LOW (ref 12–28)
BUN: 10 mg/dL (ref 8–27)
Bilirubin Total: 1.2 mg/dL (ref 0.0–1.2)
CO2: 22 mmol/L (ref 20–29)
Calcium: 9.9 mg/dL (ref 8.7–10.3)
Chloride: 105 mmol/L (ref 96–106)
Creatinine, Ser: 0.96 mg/dL (ref 0.57–1.00)
Globulin, Total: 3 g/dL (ref 1.5–4.5)
Glucose: 100 mg/dL — ABNORMAL HIGH (ref 70–99)
Potassium: 4.2 mmol/L (ref 3.5–5.2)
Sodium: 140 mmol/L (ref 134–144)
Total Protein: 7.5 g/dL (ref 6.0–8.5)
eGFR: 59 mL/min/{1.73_m2} — ABNORMAL LOW (ref >59)

## 2021-05-06 LAB — LIPID PANEL
Chol/HDL Ratio: 2.6 ratio (ref 0.0–4.4)
Cholesterol, Total: 197 mg/dL (ref 100–199)
HDL: 75 mg/dL (ref >39)
LDL Chol Calc (NIH): 107 mg/dL — ABNORMAL HIGH (ref 0–99)
Triglycerides: 82 mg/dL (ref 0–149)
VLDL Cholesterol Cal: 15 mg/dL (ref 5–40)

## 2021-05-06 LAB — CBC
Hematocrit: 41.4 % (ref 34.0–46.6)
Hemoglobin: 13.8 g/dL (ref 11.1–15.9)
MCH: 29.8 pg (ref 26.6–33.0)
MCHC: 33.3 g/dL (ref 31.5–35.7)
MCV: 89 fL (ref 79–97)
Platelets: 346 10*3/uL (ref 150–450)
RBC: 4.63 x10E6/uL (ref 3.77–5.28)
RDW: 12.5 % (ref 11.7–15.4)
WBC: 6.7 10*3/uL (ref 3.4–10.8)

## 2021-05-06 LAB — VITAMIN D 25 HYDROXY (VIT D DEFICIENCY, FRACTURES): Vit D, 25-Hydroxy: 35.5 ng/mL (ref 30.0–100.0)

## 2021-05-06 LAB — TSH: TSH: 2 u[IU]/mL (ref 0.450–4.500)

## 2021-05-14 ENCOUNTER — Other Ambulatory Visit: Payer: Self-pay | Admitting: Family Medicine

## 2021-06-01 ENCOUNTER — Ambulatory Visit (INDEPENDENT_AMBULATORY_CARE_PROVIDER_SITE_OTHER): Payer: Medicare Other

## 2021-06-01 VITALS — BP 123/78 | Ht 62.0 in | Wt 180.0 lb

## 2021-06-01 DIAGNOSIS — Z0000 Encounter for general adult medical examination without abnormal findings: Secondary | ICD-10-CM

## 2021-06-01 MED ORDER — PRAVASTATIN SODIUM 20 MG PO TABS: ORAL_TABLET | ORAL | 1 refills | Status: DC

## 2021-06-01 NOTE — Patient Instructions (Addendum)
?  Ms. Fuston , ?Thank you for taking time to come for your Medicare Wellness Visit. I appreciate your ongoing commitment to your health goals. Please review the following plan we discussed and let me know if I can assist you in the future.  ? ?These are the goals we discussed: ? Goals   ? ?  LIFESTYLE - DECREASE FALLS RISK   ?  Has new eye prescription and will get new glasses as soon as she can  ?  ?  Patient Stated   ?  Get measured for new dentures  ?  ?  Weight (lb) < 172 lb (78 kg)   ?  Patient would like to lose 30 lbs by next year. ? ?  ? ?  ?  ?This is a list of the screening recommended for you and due dates:  ?Health Maintenance  ?Topic Date Due  ? Tetanus Vaccine  08/25/2020  ? COVID-19 Vaccine (5 - Booster for Moderna series) 12/01/2020  ? Zoster (Shingles) Vaccine (2 of 2) 06/30/2021  ? Flu Shot  08/25/2021  ? Pneumonia Vaccine  Completed  ? DEXA scan (bone density measurement)  Completed  ? HPV Vaccine  Aged Out  ?  ?
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
## 2021-10-09 IMAGING — MG MM DIGITAL SCREENING BILAT W/ TOMO AND CAD
8 series · 8 of 24 positions shown · non-contrast
Comparison: Previous exam(s).

CLINICAL DATA: Screening.

EXAM:
DIGITAL SCREENING BILATERAL MAMMOGRAM WITH TOMOSYNTHESIS AND CAD
TECHNIQUE: Bilateral screening digital craniocaudal and mediolateral oblique
mammograms were obtained. Bilateral screening digital breast
tomosynthesis was performed. The images were evaluated with
computer-aided detection.

[R CC synth-2D]
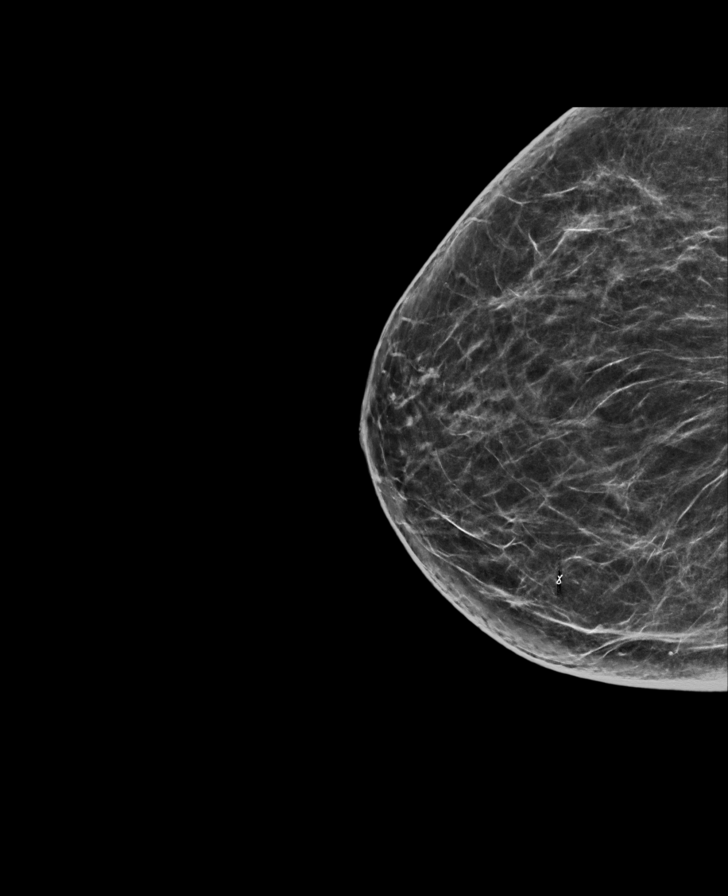

[R MLO synth-2D]
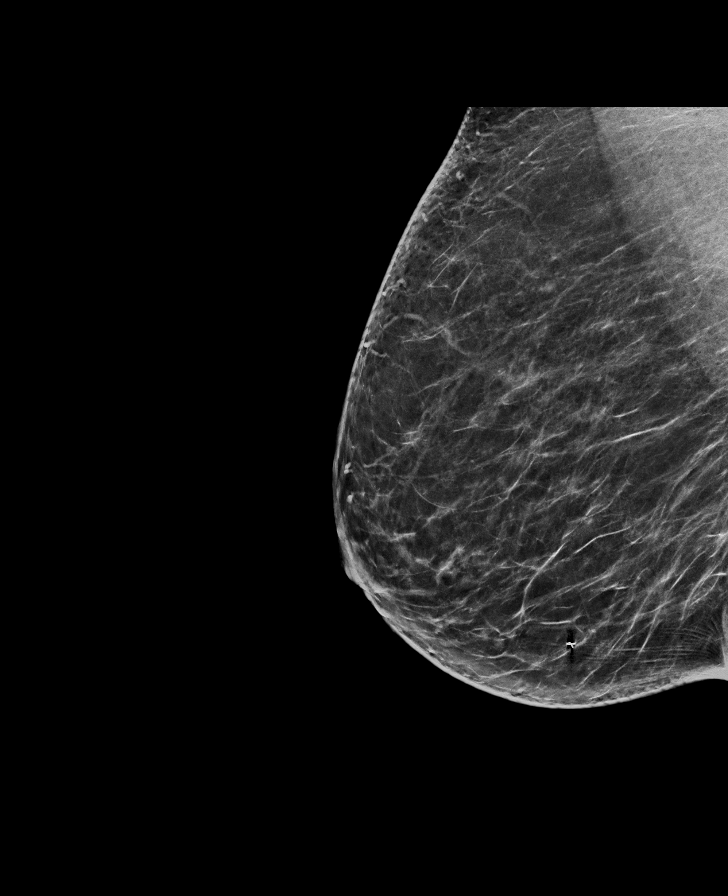

[L MLO synth-2D]
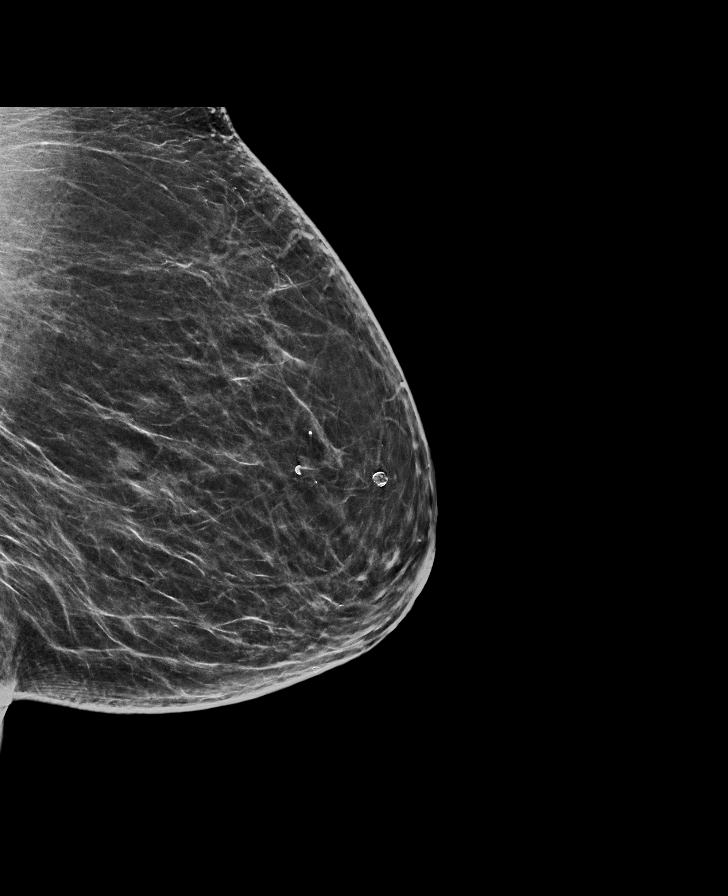

[L CC synth-2D]
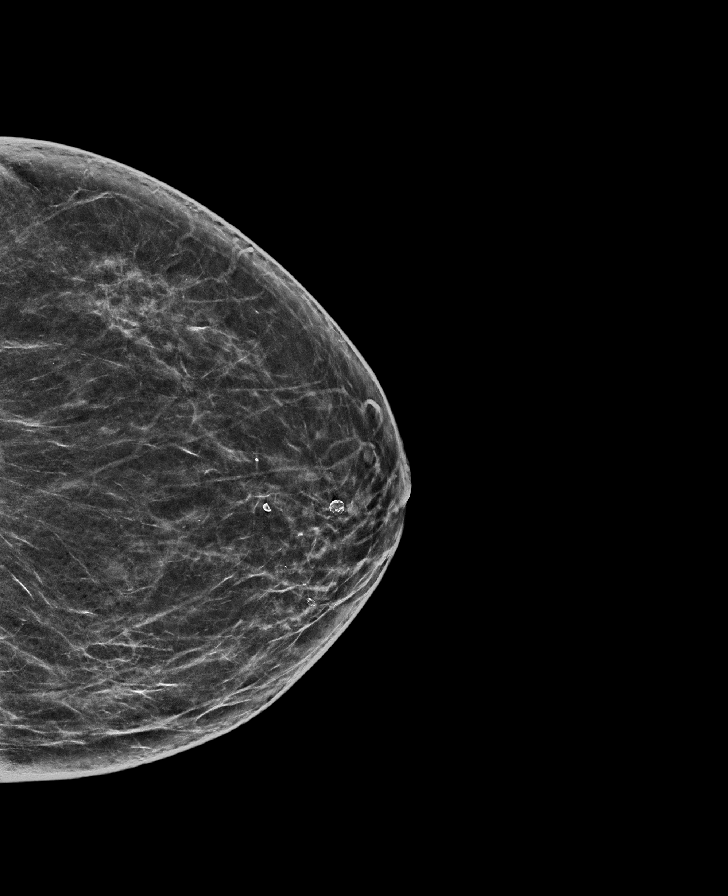

[L CC tomo · tomo slice 32/63.0]
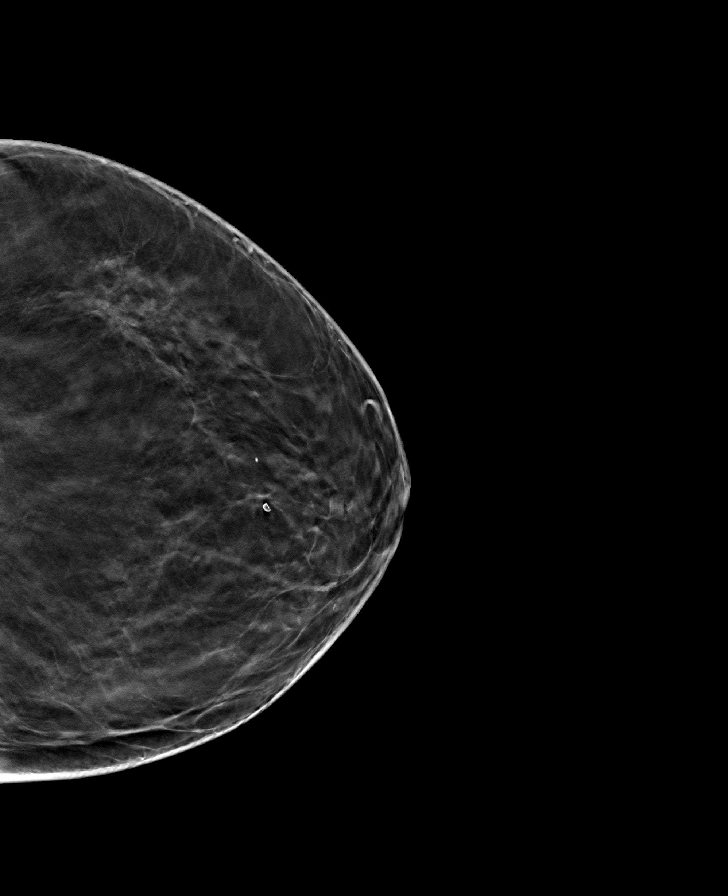

[R MLO tomo · tomo slice 36/71.0]
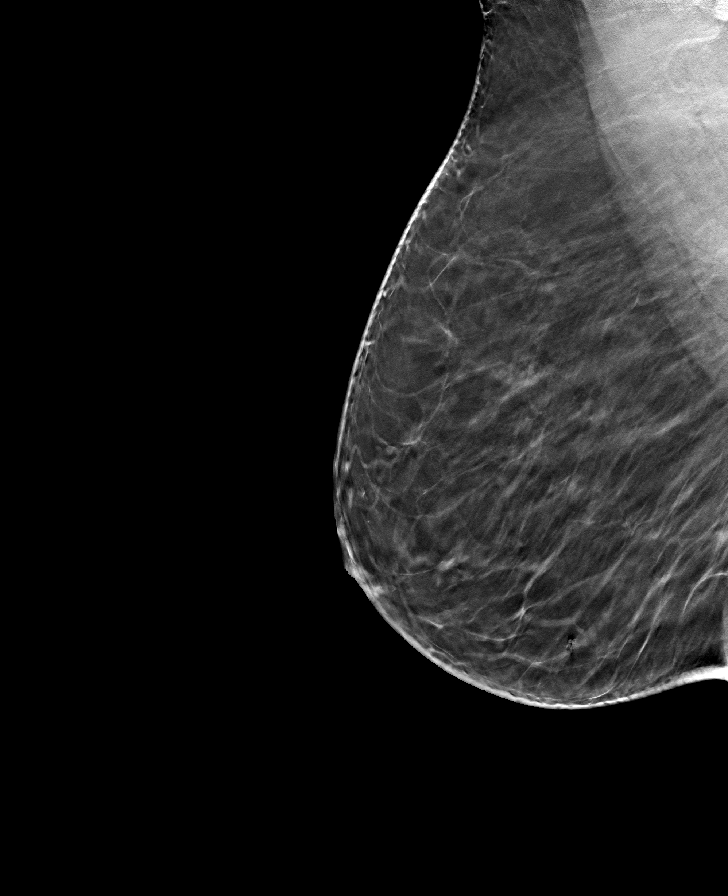

[R CC tomo · tomo slice 34/67.0]
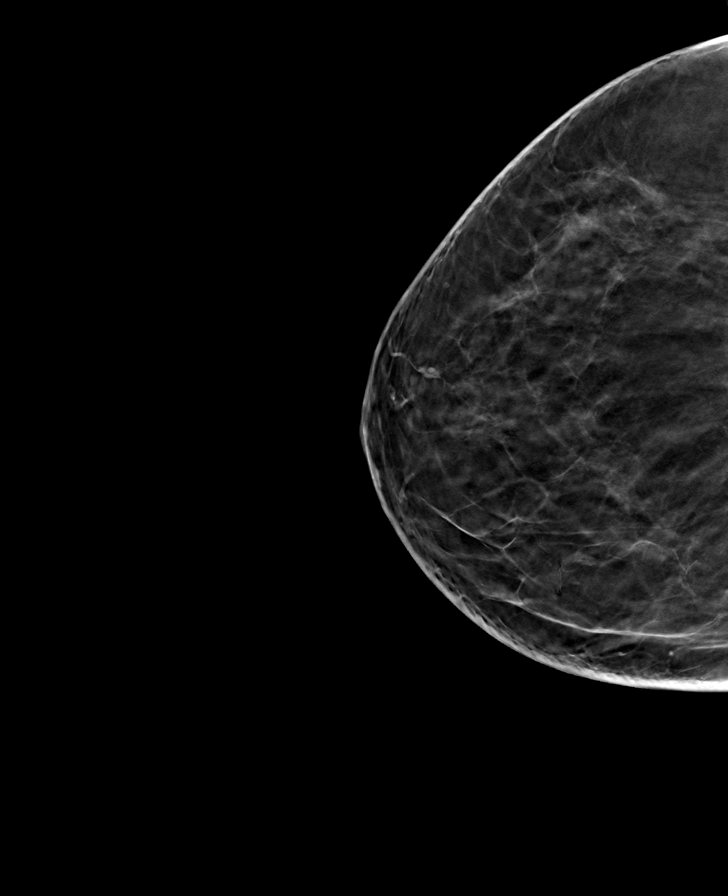

[L MLO tomo · tomo slice 34/67.0]
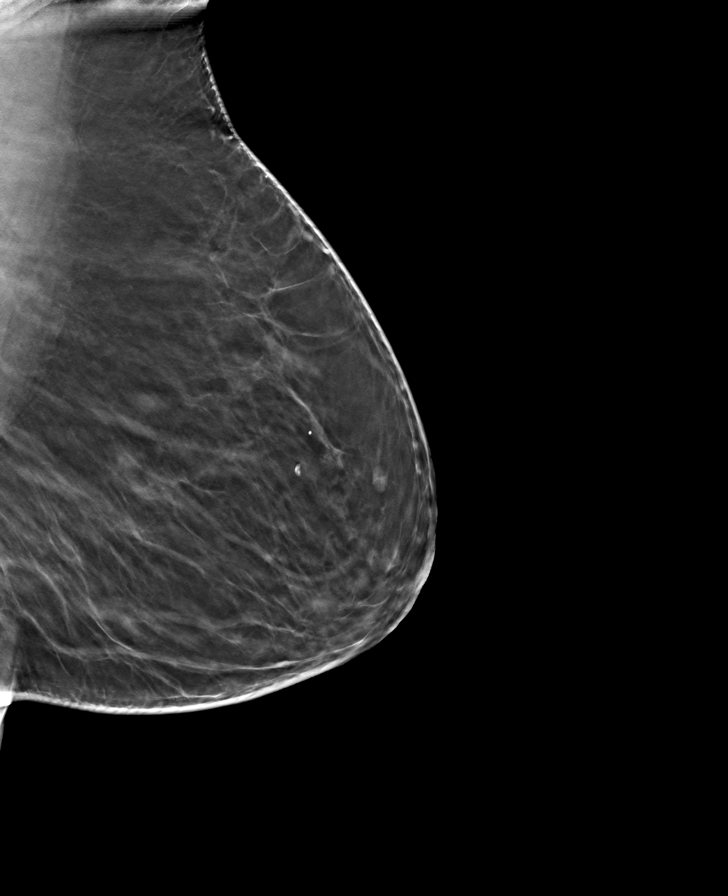

[8 of 24 positions shown; findings below may reference images not displayed]

ACR Breast Density Category b: There are scattered areas of
fibroglandular density.
FINDINGS: There are no findings suspicious for malignancy.
IMPRESSION: No mammographic evidence of malignancy. A result letter of this
screening mammogram will be mailed directly to the patient.

RECOMMENDATION:
Screening mammogram in one year. (Code:51-O-LD2)

BI-RADS CATEGORY  1: Negative.

## 2021-10-26 ENCOUNTER — Ambulatory Visit (HOSPITAL_COMMUNITY)
Admission: RE | Admit: 2021-10-26 | Discharge: 2021-10-26 | Disposition: A | Discharge status: Home / Self Care | Payer: Medicare Other | Source: Ambulatory Visit | Attending: Family Medicine | Admitting: Family Medicine

## 2021-10-26 DIAGNOSIS — Z1231 Encounter for screening mammogram for malignant neoplasm of breast: Secondary | ICD-10-CM | POA: Diagnosis not present

## 2021-12-15 ENCOUNTER — Encounter: Payer: Medicare Other | Admitting: Family Medicine

## 2021-12-15 ENCOUNTER — Encounter: Payer: Self-pay | Admitting: Family Medicine

## 2021-12-18 ENCOUNTER — Other Ambulatory Visit: Payer: Self-pay | Admitting: Family Medicine

## 2021-12-23 ENCOUNTER — Telehealth: Payer: Self-pay | Admitting: Family Medicine

## 2021-12-23 NOTE — Telephone Encounter (Signed)
Pt wants to know if she needs blood work before her CPE in Jan? Can you please let pt know if she does?

## 2021-12-23 NOTE — Telephone Encounter (Signed)
Does pt need any labs before her Jan appt?

## 2021-12-29 ENCOUNTER — Other Ambulatory Visit: Payer: Self-pay | Admitting: Family Medicine

## 2021-12-29 DIAGNOSIS — E785 Hyperlipidemia, unspecified: Secondary | ICD-10-CM

## 2021-12-29 DIAGNOSIS — I10 Essential (primary) hypertension: Secondary | ICD-10-CM

## 2021-12-29 NOTE — Telephone Encounter (Signed)
Called pt at 1:36pm. Vm full unable to leave message

## 2021-12-29 NOTE — Telephone Encounter (Signed)
Let her know she does need fasting labs a few days before her visit

## 2022-01-22 DIAGNOSIS — U071 COVID-19: Secondary | ICD-10-CM | POA: Diagnosis not present

## 2022-01-22 DIAGNOSIS — J209 Acute bronchitis, unspecified: Secondary | ICD-10-CM | POA: Diagnosis not present

## 2022-02-01 DIAGNOSIS — I10 Essential (primary) hypertension: Secondary | ICD-10-CM | POA: Diagnosis not present

## 2022-02-01 DIAGNOSIS — E785 Hyperlipidemia, unspecified: Secondary | ICD-10-CM | POA: Diagnosis not present

## 2022-02-02 LAB — CMP14+EGFR
ALT: 14 IU/L (ref 0–32)
AST: 15 IU/L (ref 0–40)
Albumin/Globulin Ratio: 1.5 (ref 1.2–2.2)
Albumin: 4.3 g/dL (ref 3.7–4.7)
Alkaline Phosphatase: 61 IU/L (ref 44–121)
BUN/Creatinine Ratio: 19 (ref 12–28)
BUN: 18 mg/dL (ref 8–27)
Bilirubin Total: 1.3 mg/dL — ABNORMAL HIGH (ref 0.0–1.2)
CO2: 25 mmol/L (ref 20–29)
Calcium: 9.7 mg/dL (ref 8.7–10.3)
Chloride: 99 mmol/L (ref 96–106)
Creatinine, Ser: 0.96 mg/dL (ref 0.57–1.00)
Globulin, Total: 2.8 g/dL (ref 1.5–4.5)
Glucose: 77 mg/dL (ref 70–99)
Potassium: 4.5 mmol/L (ref 3.5–5.2)
Sodium: 138 mmol/L (ref 134–144)
Total Protein: 7.1 g/dL (ref 6.0–8.5)
eGFR: 59 mL/min/{1.73_m2} — ABNORMAL LOW (ref >59)

## 2022-02-02 LAB — LIPID PANEL
Chol/HDL Ratio: 2.3 ratio (ref 0.0–4.4)
Cholesterol, Total: 184 mg/dL (ref 100–199)
HDL: 79 mg/dL (ref >39)
LDL Chol Calc (NIH): 93 mg/dL (ref 0–99)
Triglycerides: 66 mg/dL (ref 0–149)
VLDL Cholesterol Cal: 12 mg/dL (ref 5–40)

## 2022-02-03 ENCOUNTER — Ambulatory Visit (INDEPENDENT_AMBULATORY_CARE_PROVIDER_SITE_OTHER): Payer: Medicare Other | Admitting: Family Medicine

## 2022-02-03 ENCOUNTER — Encounter: Payer: Self-pay | Admitting: Family Medicine

## 2022-02-03 VITALS — BP 126/84 | HR 78 | Ht 62.0 in | Wt 170.0 lb

## 2022-02-03 DIAGNOSIS — Z0001 Encounter for general adult medical examination with abnormal findings: Secondary | ICD-10-CM | POA: Insufficient documentation

## 2022-02-03 DIAGNOSIS — Z0000 Encounter for general adult medical examination without abnormal findings: Secondary | ICD-10-CM

## 2022-02-03 DIAGNOSIS — Z23 Encounter for immunization: Secondary | ICD-10-CM

## 2022-02-03 NOTE — Patient Instructions (Signed)
F/U in 6 months, call if you need me sooner  Flu vaccine today  Please get RSV vaccine next 1 to 2 weeks  Labs are excellent   Nurse please verify and document Shingrix #2 , [employer]

## 2022-02-03 NOTE — Assessment & Plan Note (Signed)

## 2022-02-03 NOTE — Progress Notes (Signed)
    Allison [name]     MRN: [id number]      DOB: 1940-07-26  HPI: Patient is in for annual physical exam. No other health concerns are expressed or addressed at the visit. Recent labs,  are reviewed. Immunization is reviewed , and  updated if needed.   PE: BP 126/84   Pulse 78   Ht '5\' 2"'$  (1.575 m)   Wt 170 lb 0.6 oz (77.1 kg)   SpO2 96%   BMI 31.10 kg/m   Pleasant  female, alert and oriented x 3, in no cardio-pulmonary distress. Afebrile. HEENT No facial trauma or asymetry. Sinuses non tender.  Extra occullar muscles intact.. External ears normal, . Neck: supple, no adenopathy,JVD or thyromegaly.No bruits.  Chest: Clear to ascultation bilaterally.No crackles or wheezes. Non tender to palpation  Cardiovascular system; Heart sounds normal,  S1 and  S2 ,no S3.  No murmur, or thrill. Apical beat not displaced Peripheral pulses normal.  Abdomen: Soft, non tender, no organomegaly or masses. No bruits. Bowel sounds normal. No guarding, tenderness or rebound.   Musculoskeletal exam: Full ROM of spine, hips , shoulders and knees. No deformity ,swelling or crepitus noted. No muscle wasting or atrophy.   Neurologic: Cranial nerves 2 to 12 intact. Power, tone ,sensation and reflexes normal throughout. No disturbance in gait. No tremor.  Skin: Intact, no ulceration, erythema , scaling or rash noted. Pigmentation normal throughout  Psych; Normal mood and affect. Judgement and concentration normal   Assessment & Plan:  Encounter for annual health examination Annual exam as documented. Counseling done  re healthy lifestyle involving commitment to 150 minutes exercise per week, heart healthy diet, and attaining healthy weight.The importance of adequate sleep also discussed. Regular seat belt use and home safety, is also discussed. Changes in health habits are decided on by the patient with goals and time frames  set for achieving them. Immunization and cancer  screening needs are specifically addressed at this visit.   Need for immunization against influenza After obtaining informed consent, the vaccine is  administered , with no adverse effect noted at the time of administration.

## 2022-02-03 NOTE — Assessment & Plan Note (Signed)
After obtaining informed consent, the vaccine is  administered , with no adverse effect noted at the time of administration.  

## 2022-04-02 ENCOUNTER — Other Ambulatory Visit: Payer: Self-pay | Admitting: Family Medicine

## 2022-04-12 ENCOUNTER — Other Ambulatory Visit: Payer: Self-pay | Admitting: Family Medicine

## 2022-06-07 ENCOUNTER — Ambulatory Visit (INDEPENDENT_AMBULATORY_CARE_PROVIDER_SITE_OTHER): Payer: Medicare Other

## 2022-06-07 VITALS — BP 126/84 | HR 74 | Ht 62.0 in | Wt 176.0 lb

## 2022-06-07 DIAGNOSIS — Z0000 Encounter for general adult medical examination without abnormal findings: Secondary | ICD-10-CM

## 2022-06-07 NOTE — Patient Instructions (Signed)
  Allison [name] , Thank you for taking time to come for your Medicare Wellness Visit. I appreciate your ongoing commitment to your health goals. Please review the following plan we discussed and let me know if I can assist you in the future.   These are the goals we discussed:  Goals      LIFESTYLE - DECREASE FALLS RISK     Has new eye prescription and will get new glasses as soon as she can      Patient Stated     Get measured for new dentures      Patient Stated     [employer] and fixing house up      Weight (lb) < 172 lb (78 kg)     Patient would like to lose 30 lbs by next year.        This is a list of the screening recommended for you and due dates:  Health Maintenance  Topic Date Due   DTaP/Tdap/Td vaccine (3 - Td or Tdap) 08/25/2020   COVID-19 Vaccine (5 - 2023-24 season) 09/25/2021   Flu Shot  08/26/2022   Medicare Annual Wellness Visit  06/07/2023   Pneumonia Vaccine  Completed   DEXA scan (bone density measurement)  Completed   Zoster (Shingles) Vaccine  Completed   HPV Vaccine  Aged Out
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
## 2022-07-05 ENCOUNTER — Other Ambulatory Visit: Payer: Self-pay | Admitting: Family Medicine

## 2022-08-06 ENCOUNTER — Ambulatory Visit (INDEPENDENT_AMBULATORY_CARE_PROVIDER_SITE_OTHER): Payer: Medicare Other | Admitting: Family Medicine

## 2022-08-06 ENCOUNTER — Encounter: Payer: Self-pay | Admitting: Family Medicine

## 2022-08-06 VITALS — BP 132/87 | HR 74 | Ht 62.0 in | Wt 171.0 lb

## 2022-08-06 DIAGNOSIS — Z1231 Encounter for screening mammogram for malignant neoplasm of breast: Secondary | ICD-10-CM

## 2022-08-06 DIAGNOSIS — E559 Vitamin D deficiency, unspecified: Secondary | ICD-10-CM | POA: Diagnosis not present

## 2022-08-06 DIAGNOSIS — E669 Obesity, unspecified: Secondary | ICD-10-CM

## 2022-08-06 DIAGNOSIS — E785 Hyperlipidemia, unspecified: Secondary | ICD-10-CM

## 2022-08-06 DIAGNOSIS — I10 Essential (primary) hypertension: Secondary | ICD-10-CM

## 2022-08-06 DIAGNOSIS — E66811 Obesity, class 1: Secondary | ICD-10-CM

## 2022-08-06 NOTE — Progress Notes (Signed)
Allison [name]     MRN: [id number]      DOB: [birth year]  Chief Complaint  Patient presents with   Follow-up    Follow up    HPI Allison [name] is here for follow up and re-evaluation of chronic medical conditions, medication management and review of any available recent lab and radiology data.  Preventive health is updated, specifically  Cancer screening and Immunization.   Questions or concerns regarding consultations or procedures which the PT has had in the interim are  addressed. The PT denies any adverse reactions to current medications since the last visit.  There are no new concerns.  There are no specific complaints   ROS Denies recent fever or chills. Denies sinus pressure, nasal congestion, ear pain or sore throat. Denies chest congestion, productive cough or wheezing. Denies chest pains, palpitations and leg swelling Denies abdominal pain, nausea, vomiting,diarrhea or constipation.   Denies dysuria, frequency, hesitancy or incontinence. C/o intermittent rigth knee pain, no buckling, had replacement over 20 years ago Denies headaches, seizures, numbness, or tingling. Denies depression, anxiety or insomnia. Denies skin break down or rash.   PE  BP 132/87 (BP Location: Right Arm, Patient Position: Sitting, Cuff Size: Large)   Pulse 74   Ht 5\' 2"  (1.575 m)   Wt 171 lb 0.6 oz (77.6 kg)   SpO2 95%   BMI 31.28 kg/m   Patient alert and oriented and in no cardiopulmonary distress.  HEENT: No facial asymmetry, EOMI,     Neck supple .  Chest: Clear to auscultation bilaterally.  CVS: S1, S2 no murmurs, no S3.Regular rate.  ABD: Soft non tender.   Ext: No edema  MS: Adequate ROM spine, shoulders, hips and knees.  Skin: Intact, no ulcerations or rash noted.  Psych: Good eye contact, normal affect. Memory intact not anxious or depressed appearing.  CNS: CN 2-12 intact, power,  normal throughout.no focal deficits noted.   Assessment & Plan  Essential  hypertension Controlled, no change in medication DASH diet and commitment to daily physical activity for a minimum of 30 minutes discussed and encouraged, as a part of hypertension management. The importance of attaining a healthy weight is also discussed.     08/06/2022   10:17 AM 06/07/2022    9:13 AM 02/03/2022   10:42 AM 02/03/2022    9:54 AM 06/01/2021   10:07 AM 05/05/2021    8:56 AM 01/20/2021   12:13 PM  BP/Weight  Systolic BP 132 126 126 126 123 126 139  Diastolic BP 87 84 84 88 78 80 63  Wt. (Lbs) 171.04 176  170.04 180 177.04   BMI 31.28 kg/m2 32.19 kg/m2  31.1 kg/m2 32.92 kg/m2 32.38 kg/m2        Hyperlipidemia LDL goal <100 Hyperlipidemia:Low fat diet discussed and encouraged.   Lipid Panel  Lab Results  Component Value Date   CHOL 184 02/01/2022   HDL 79 02/01/2022   LDLCALC 93 02/01/2022   TRIG 66 02/01/2022   CHOLHDL 2.3 02/01/2022     Updated lab needed at/ before next visit.   Obesity  Patient re-educated about  the importance of commitment to a  minimum of 150 minutes of exercise per week as able.  The importance of healthy food choices with portion control discussed, as well as eating regularly and within a 12 hour window most days. The need to choose "clean , green" food 50 to 75% of the time is discussed, as well as to make water  the primary drink and set a goal of 64 ounces water daily.       08/06/2022   10:17 AM 06/07/2022    9:13 AM 02/03/2022    9:54 AM  Weight /BMI  Weight 171 lb 0.6 oz 176 lb 170 lb 0.6 oz  Height 5\' 2"  (1.575 m) 5\' 2"  (1.575 m) 5\' 2"  (1.575 m)  BMI 31.28 kg/m2 32.19 kg/m2 31.1 kg/m2    Improved  Vitamin D deficiency Updated lab needed at/ before next visit.

## 2022-08-06 NOTE — Assessment & Plan Note (Signed)
Controlled, no change in medication DASH diet and commitment to daily physical activity for a minimum of 30 minutes discussed and encouraged, as a part of hypertension management. The importance of attaining a healthy weight is also discussed.     08/06/2022   10:17 AM 06/07/2022    9:13 AM 02/03/2022   10:42 AM 02/03/2022    9:54 AM 06/01/2021   10:07 AM 05/05/2021    8:56 AM 01/20/2021   12:13 PM  BP/Weight  Systolic BP 132 126 126 126 123 126 139  Diastolic BP 87 84 84 88 78 80 63  Wt. (Lbs) 171.04 176  170.04 180 177.04   BMI 31.28 kg/m2 32.19 kg/m2  31.1 kg/m2 32.92 kg/m2 32.38 kg/m2

## 2022-08-06 NOTE — Patient Instructions (Addendum)
Annual exam  with MD , Jan 11 or after , call if you need me sooner  Labs today, CBC, lipid, cmp and EGFr, TSH, vit D  Please schedule mammogram for October, 3 or after  It is important that you exercise regularly at least 30 minutes 5 times a week. If you develop chest pain, have severe difficulty breathing, or feel very tired, stop exercising immediately and seek medical attention    Think about what you will eat, plan ahead. Choose " clean, green, fresh or frozen" over canned, processed or packaged foods which are more sugary, salty and fatty. 70 to 75% of food eaten should be vegetables and fruit. Three meals at set times with snacks allowed between meals, but they must be fruit or vegetables. Aim to eat over a 12 hour period , example 7 am to 7 pm, and STOP after  your last meal of the day. Drink water,generally about 64 ounces per day, no other drink is as healthy. Fruit juice is best enjoyed in a healthy way, by EATING the fruit.   Thanks for choosing [facility], we consider it a privelige to serve you.

## 2022-08-06 NOTE — Assessment & Plan Note (Signed)
Updated lab needed at/ before next visit.   

## 2022-08-06 NOTE — Assessment & Plan Note (Signed)
  Patient re-educated about  the importance of commitment to a  minimum of 150 minutes of exercise per week as able.  The importance of healthy food choices with portion control discussed, as well as eating regularly and within a 12 hour window most days. The need to choose "clean , green" food 50 to 75% of the time is discussed, as well as to make water the primary drink and set a goal of 64 ounces water daily.       08/06/2022   10:17 AM 06/07/2022    9:13 AM 02/03/2022    9:54 AM  Weight /BMI  Weight 171 lb 0.6 oz 176 lb 170 lb 0.6 oz  Height 5\' 2"  (1.575 m) 5\' 2"  (1.575 m) 5\' 2"  (1.575 m)  BMI 31.28 kg/m2 32.19 kg/m2 31.1 kg/m2    Improved

## 2022-08-06 NOTE — Assessment & Plan Note (Signed)
Hyperlipidemia:Low fat diet discussed and encouraged.   Lipid Panel  Lab Results  Component Value Date   CHOL 184 02/01/2022   HDL 79 02/01/2022   LDLCALC 93 02/01/2022   TRIG 66 02/01/2022   CHOLHDL 2.3 02/01/2022     Updated lab needed at/ before next visit.

## 2022-08-07 LAB — CMP14+EGFR
ALT: 12 IU/L (ref 0–32)
AST: 12 IU/L (ref 0–40)
Albumin: 4.3 g/dL (ref 3.7–4.7)
Alkaline Phosphatase: 76 IU/L (ref 44–121)
BUN/Creatinine Ratio: 11 — ABNORMAL LOW (ref 12–28)
BUN: 10 mg/dL (ref 8–27)
Bilirubin Total: 1.4 mg/dL — ABNORMAL HIGH (ref 0.0–1.2)
CO2: 26 mmol/L (ref 20–29)
Calcium: 10 mg/dL (ref 8.7–10.3)
Chloride: 103 mmol/L (ref 96–106)
Creatinine, Ser: 0.92 mg/dL (ref 0.57–1.00)
Globulin, Total: 3.1 g/dL (ref 1.5–4.5)
Glucose: 89 mg/dL (ref 70–99)
Potassium: 4.6 mmol/L (ref 3.5–5.2)
Sodium: 142 mmol/L (ref 134–144)
Total Protein: 7.4 g/dL (ref 6.0–8.5)
eGFR: 62 mL/min/{1.73_m2} (ref >59)

## 2022-08-07 LAB — CBC
Hematocrit: 42.7 % (ref 34.0–46.6)
Hemoglobin: 14 g/dL (ref 11.1–15.9)
MCH: 29.8 pg (ref 26.6–33.0)
MCHC: 32.8 g/dL (ref 31.5–35.7)
MCV: 91 fL (ref 79–97)
Platelets: 390 10*3/uL (ref 150–450)
RBC: 4.7 x10E6/uL (ref 3.77–5.28)
RDW: 12.1 % (ref 11.7–15.4)
WBC: 6.9 10*3/uL (ref 3.4–10.8)

## 2022-08-07 LAB — LIPID PANEL
Chol/HDL Ratio: 2.8 ratio (ref 0.0–4.4)
Cholesterol, Total: 184 mg/dL (ref 100–199)
HDL: 65 mg/dL (ref >39)
LDL Chol Calc (NIH): 104 mg/dL — ABNORMAL HIGH (ref 0–99)
Triglycerides: 85 mg/dL (ref 0–149)
VLDL Cholesterol Cal: 15 mg/dL (ref 5–40)

## 2022-08-07 LAB — VITAMIN D 25 HYDROXY (VIT D DEFICIENCY, FRACTURES): Vit D, 25-Hydroxy: 33.4 ng/mL (ref 30.0–100.0)

## 2022-08-07 LAB — TSH: TSH: 1.71 u[IU]/mL (ref 0.450–4.500)

## 2022-10-29 ENCOUNTER — Ambulatory Visit (HOSPITAL_COMMUNITY)
Admission: RE | Admit: 2022-10-29 | Discharge: 2022-10-29 | Disposition: A | Discharge status: Home / Self Care | Payer: Medicare Other | Source: Ambulatory Visit | Attending: Family Medicine | Admitting: Family Medicine

## 2022-10-29 DIAGNOSIS — Z1231 Encounter for screening mammogram for malignant neoplasm of breast: Secondary | ICD-10-CM | POA: Diagnosis not present

## 2023-02-08 ENCOUNTER — Telehealth: Payer: Self-pay | Admitting: Family Medicine

## 2023-02-08 NOTE — Telephone Encounter (Signed)
 Copied from CRM [id number]. Topic: Appointments - Scheduling Inquiry for Clinic >> Feb 08, 2023  2:08 PM Fredrica W wrote: Reason for CRM: Patient called wanted to know if she will need to do bloodwork for her upcoming appointment. Thank You

## 2023-02-09 ENCOUNTER — Other Ambulatory Visit: Payer: Self-pay

## 2023-02-09 DIAGNOSIS — I10 Essential (primary) hypertension: Secondary | ICD-10-CM

## 2023-02-09 DIAGNOSIS — E785 Hyperlipidemia, unspecified: Secondary | ICD-10-CM

## 2023-02-09 NOTE — Telephone Encounter (Signed)
 Labs ordered voicemail full

## 2023-02-10 ENCOUNTER — Encounter: Payer: Self-pay | Admitting: Family Medicine

## 2023-02-10 ENCOUNTER — Ambulatory Visit (INDEPENDENT_AMBULATORY_CARE_PROVIDER_SITE_OTHER): Payer: Medicare Other | Admitting: Family Medicine

## 2023-02-10 VITALS — BP 98/64 | HR 75 | Ht 62.0 in | Wt 164.1 lb

## 2023-02-10 DIAGNOSIS — Z0001 Encounter for general adult medical examination with abnormal findings: Secondary | ICD-10-CM | POA: Diagnosis not present

## 2023-02-10 DIAGNOSIS — R7301 Impaired fasting glucose: Secondary | ICD-10-CM

## 2023-02-10 DIAGNOSIS — M7551 Bursitis of right shoulder: Secondary | ICD-10-CM

## 2023-02-10 DIAGNOSIS — J309 Allergic rhinitis, unspecified: Secondary | ICD-10-CM

## 2023-02-10 DIAGNOSIS — I10 Essential (primary) hypertension: Secondary | ICD-10-CM | POA: Diagnosis not present

## 2023-02-10 DIAGNOSIS — Z23 Encounter for immunization: Secondary | ICD-10-CM

## 2023-02-10 DIAGNOSIS — E559 Vitamin D deficiency, unspecified: Secondary | ICD-10-CM

## 2023-02-10 DIAGNOSIS — E785 Hyperlipidemia, unspecified: Secondary | ICD-10-CM | POA: Diagnosis not present

## 2023-02-10 MED ORDER — PREDNISONE 10 MG PO TABS: 10.0000 mg | ORAL_TABLET | Freq: Two times a day (BID) | ORAL | 0 refills | Status: DC

## 2023-02-10 MED ORDER — MONTELUKAST SODIUM 10 MG PO TABS: 10.0000 mg | ORAL_TABLET | Freq: Every day | ORAL | 2 refills | Status: DC

## 2023-02-10 MED ORDER — MELOXICAM 7.5 MG PO TABS: 7.5000 mg | ORAL_TABLET | Freq: Every day | ORAL | 0 refills | Status: DC

## 2023-02-10 NOTE — Patient Instructions (Addendum)
F/U in 5 months, call if you need me sooner  Labs to be obtained before 5 month f/u CBC, TSH. Vit D, fasting lipid, cmp and eGFr  New for allergies is montelukast one daily  New for bursitis right shoulder is prednisone for 5 days, then next week take meloxicam one daily for 2 weeks If shoulder not better in  2 to 3 weeks , call , and I will refer  you to Orthopedics  Flu vaccine today  Tetanus and covid are due and are at your pharmacy, pls get them next 2 to 3 weeks  Labs today , we will call you with the result  Thanks for choosing [facility], we consider it a privelige to serve you.   Marland Kitchen

## 2023-02-10 NOTE — Progress Notes (Signed)
    Allison [name]     MRN: [id number]      DOB: 29-Dec-1940  Chief Complaint  Patient presents with   Annual Exam    Runny nose sneezing cpe flu shot    HPI: Patient is in for annual physical exam. Swelling and pain of right shoulder limiting movement, started after lifting flower pot onto shelf approx 2 weeks ago. C/o increased clear runny nose and sneezing in apst 1 week, no fever , cough chills or body aches Recent labs,  are reviewed. Immunization is reviewed , and  is updated i   PE: BP 98/64 (BP Location: Right Arm, Patient Position: Sitting, Cuff Size: Large)   Pulse 75   Ht 5\' 2"  (1.575 m)   Wt 164 lb 1.9 oz (74.4 kg)   SpO2 97%   BMI 30.02 kg/m   Pleasant  female, alert and oriented x 3, in no cardio-pulmonary distress. Afebrile. HEENT No facial trauma or asymetry. Sinuses non tender. Nsal congestion, no conjunctival erythema , occasional sneezing during visit Extra occullar muscles intact.. External ears normal, . Neck: supple, no adenopathy,JVD or thyromegaly.No bruits.  Chest: Clear to ascultation bilaterally.No crackles or wheezes. Non tender to palpation  Cardiovascular system; Heart sounds normal,  S1 and  S2 ,no S3.  No murmur, or thrill. Apical beat not displaced Peripheral pulses normal.  Abdomen: Soft, non tender, no organomegaly or masses. No bruits. Bowel sounds normal. No guarding, tenderness or rebound.     Musculoskeletal exam: Full ROM of spine, hips ,  and knees.decreased ROM right shoulder with swelling and tenderness . No muscle wasting or atrophy.   Neurologic: Cranial nerves 2 to 12 intact. Power, tone ,sensation and reflexes normal throughout. No disturbance in gait. No tremor.  Skin: Intact, no ulceration, erythema , scaling or rash noted. Pigmentation normal throughout  Psych; Normal mood and affect. Judgement and concentration normal   Assessment & Plan:  Encounter for Medicare annual examination with abnormal  findings Annual exam as documented. Counseling done  re healthy lifestyle involving commitment to 150 minutes exercise per week, heart healthy diet, and attaining healthy weight.The importance of adequate sleep also discussed. Regular seat belt use and home safety, is also discussed. Changes in health habits are decided on by the patient with goals and time frames  set for achieving them. Immunization and cancer screening needs are specifically addressed at this visit.   Acute bursitis of right shoulder Short course predniosne followed by meloxicam, will refer to Ortho if persists  or worsens in next approx 3 weeks, pt to call back if needed  Allergic rhinitis Start daily singulair  Encounter for immunization After obtaining informed consent, the influenza  vaccine is  administered , with no adverse effect noted at the time of administration.

## 2023-02-11 LAB — CMP14+EGFR
ALT: 11 [IU]/L (ref 0–32)
AST: 11 [IU]/L (ref 0–40)
Albumin: 4.4 g/dL (ref 3.7–4.7)
Alkaline Phosphatase: 79 [IU]/L (ref 44–121)
BUN/Creatinine Ratio: 12 (ref 12–28)
BUN: 11 mg/dL (ref 8–27)
Bilirubin Total: 1.3 mg/dL — ABNORMAL HIGH (ref 0.0–1.2)
CO2: 25 mmol/L (ref 20–29)
Calcium: 9.8 mg/dL (ref 8.7–10.3)
Chloride: 104 mmol/L (ref 96–106)
Creatinine, Ser: 0.92 mg/dL (ref 0.57–1.00)
Globulin, Total: 2.8 g/dL (ref 1.5–4.5)
Glucose: 87 mg/dL (ref 70–99)
Potassium: 4.5 mmol/L (ref 3.5–5.2)
Sodium: 142 mmol/L (ref 134–144)
Total Protein: 7.2 g/dL (ref 6.0–8.5)
eGFR: 61 mL/min/{1.73_m2} (ref >59)

## 2023-02-11 LAB — LIPID PANEL
Chol/HDL Ratio: 2.4 {ratio} (ref 0.0–4.4)
Cholesterol, Total: 178 mg/dL (ref 100–199)
HDL: 74 mg/dL (ref >39)
LDL Chol Calc (NIH): 89 mg/dL (ref 0–99)
Triglycerides: 84 mg/dL (ref 0–149)
VLDL Cholesterol Cal: 15 mg/dL (ref 5–40)

## 2023-02-14 DIAGNOSIS — M7551 Bursitis of right shoulder: Secondary | ICD-10-CM | POA: Insufficient documentation

## 2023-02-14 DIAGNOSIS — J309 Allergic rhinitis, unspecified: Secondary | ICD-10-CM | POA: Insufficient documentation

## 2023-02-14 DIAGNOSIS — Z23 Encounter for immunization: Secondary | ICD-10-CM | POA: Insufficient documentation

## 2023-02-14 NOTE — Assessment & Plan Note (Signed)
After obtaining informed consent, the influenza vaccine is  administered , with no adverse effect noted at the time of administration.

## 2023-02-14 NOTE — Assessment & Plan Note (Signed)
Start daily singulair

## 2023-02-14 NOTE — Assessment & Plan Note (Signed)

## 2023-02-14 NOTE — Assessment & Plan Note (Signed)
Short course predniosne followed by meloxicam, will refer to Ortho if persists  or worsens in next approx 3 weeks, pt to call back if needed

## 2023-04-10 ENCOUNTER — Other Ambulatory Visit: Payer: Self-pay | Admitting: Family Medicine

## 2023-04-30 ENCOUNTER — Other Ambulatory Visit: Payer: Self-pay | Admitting: Family Medicine

## 2023-06-15 ENCOUNTER — Ambulatory Visit (INDEPENDENT_AMBULATORY_CARE_PROVIDER_SITE_OTHER): Payer: Medicare Other

## 2023-06-15 VITALS — Ht 62.5 in | Wt 168.0 lb

## 2023-06-15 DIAGNOSIS — Z0000 Encounter for general adult medical examination without abnormal findings: Secondary | ICD-10-CM | POA: Diagnosis not present

## 2023-06-15 DIAGNOSIS — Z780 Asymptomatic menopausal state: Secondary | ICD-10-CM | POA: Diagnosis not present

## 2023-06-15 NOTE — Patient Instructions (Signed)
 Allison [name] , Thank you for taking time out of your busy schedule to complete your Annual Wellness Visit with me. I enjoyed our conversation and look forward to speaking with you again next year. I, as well as your care team,  appreciate your ongoing commitment to your health goals. Please review the following plan we discussed and let me know if I can assist you in the future. Your Game plan/ To Do List    Referrals: If you haven't heard from the office you've been referred to, please reach out to them at the phone number provided.  Your osteoporosis screening has been scheduled for Jun 23, 2023 at 10:30 am at [facility]. Please arrive at Radiology 15 minutes prior to your appointment time.  -Make sure to wear two-piece clothing. Make sure to bring picture ID and insurance card.  -Bring list of medications you are currently taking including any supplements.  -Please discontinue any medications that contain calcium  at least 48 hours (2 days) prior to your scan.   Follow up Visits: Next Medicare AWV with our clinical staff:   Jun 19, 2024 at 3:50 p Have you seen your provider in the last 6 months (3 months if uncontrolled diabetes)? Yes Next Office Visit with your provider:   July 12, 2023 at 10:00 am  Clinician Recommendations:   Aim for 30 minutes of exercise or brisk walking, 6-8 glasses of water , and 5 servings of fruits and vegetables each day.  I enjoyed our conversation today and look forward to talking with you again next year!! Have a wonderful and safe year. All the best, Damione Robideau This is a list of the screening recommended for you and due dates:  Health Maintenance  Topic Date Due   DTaP/Tdap/Td vaccine (3 - Td or Tdap) 08/25/2020   DEXA scan (bone density measurement)  09/05/2020   COVID-19 Vaccine (6 - 2024-25 season) 04/18/2023   Flu Shot  08/26/2023   Mammogram  10/29/2023   Medicare Annual Wellness Visit  06/14/2024   Pneumonia Vaccine  Completed   Zoster (Shingles)  Vaccine  Completed   HPV Vaccine  Aged Out   Meningitis B Vaccine  Aged Out    Advanced directives: (Declined) Advance directive discussed with you today. Even though you declined this today, please call our office should you change your mind, and we can give you the proper paperwork for you to fill out. Advance Care Planning is important because it:  [x]  Makes sure you receive the medical care that is consistent with your values, goals, and preferences  [x]  It provides guidance to your family and loved ones and reduces their decisional burden about whether or not they are making the right decisions based on your wishes.  Follow the link provided in your after visit summary or read over the paperwork we have mailed to you to help you started getting your Advance Directives in place. If you need assistance in completing these, please reach out to us  so that we can help you! See attachments for Preventive Care and Fall Prevention Tips.  Understanding Your Risk for Falls Millions of people have serious injuries from falls each year. It is important to understand your risk of falling. Talk with your health care provider about your risk and what you can do to lower it. If you do have a serious fall, make sure to tell your provider. Falling once raises your risk of falling again. How can falls affect me? Serious injuries from falls are  common. These include: Broken bones, such as hip fractures. Head injuries, such as traumatic brain injuries (TBI) or concussions. A fear of falling can cause you to avoid activities and stay at home. This can make your muscles weaker and raise your risk for a fall. What can increase my risk? There are a number of risk factors that increase your risk for falling. The more risk factors you have, the higher your risk of falling. Serious injuries from a fall happen most often to people who are older than 85 years old. Teenagers and young adults ages 14-29 are also at  higher risk. Common risk factors include: Weakness in the lower body. Being generally weak or confused due to long-term (chronic) illness. Dizziness or balance problems. Poor vision. Medicines that cause dizziness or drowsiness. These may include: Medicines for your blood pressure, heart, anxiety, insomnia, or swelling (edema). Pain medicines. Muscle relaxants. Other risk factors include: Drinking alcohol. Having had a fall in the past. Having foot pain or wearing improper footwear. Working at a dangerous job. Having any of the following in your home: Tripping hazards, such as floor clutter or loose rugs. Poor lighting. Pets. Having dementia or memory loss. What actions can I take to lower my risk of falling?     Physical activity Stay physically fit. Do strength and balance exercises. Consider taking a regular class to build strength and balance. Yoga and tai chi are good options. Vision Have your eyes checked every year and your prescription for glasses or contacts updated as needed. Shoes and walking aids Wear non-skid shoes. Wear shoes that have rubber soles and low heels. Do not wear high heels. Do not walk around the house in socks or slippers. Use a cane or walker as told by your provider. Home safety Attach secure railings on both sides of your stairs. Install grab bars for your bathtub, shower, and toilet. Use a non-skid mat in your bathtub or shower. Attach bath mats securely with double-sided, non-slip rug tape. Use good lighting in all rooms. Keep a flashlight near your bed. Make sure there is a clear path from your bed to the bathroom. Use night-lights. Do not use throw rugs. Make sure all carpeting is taped or tacked down securely. Remove all clutter from walkways and stairways, including extension cords. Repair uneven or broken steps and floors. Avoid walking on icy or slippery surfaces. Walk on the grass instead of on icy or slick sidewalks. Use ice melter to  get rid of ice on walkways in the winter. Use a cordless phone. Questions to ask your health care provider Can you help me check my risk for a fall? Do any of my medicines make me more likely to fall? Should I take a vitamin D  supplement? What exercises can I do to improve my strength and balance? Should I make an appointment to have my vision checked? Do I need a bone density test to check for weak bones (osteoporosis)? Would it help to use a cane or a walker? Where to find more information Centers for Disease Control and Prevention, [employer]: TonerPromos.no Community-Based Fall Prevention Programs: TonerPromos.no [employer] on Aging: BaseRingT[employer].pl Contact a health care provider if: You fall at home. You are afraid of falling at home. You feel weak, drowsy, or dizzy. This information is not intended to replace advice given to you by your health care provider. Make sure you discuss any questions you have with your health care provider. Document Revised: 09/14/2021 Document Reviewed: 09/14/2021 Elsevier Patient Education  2024 [employer]  Understanding Your Risk for Falls Millions of people have serious injuries from falls each year. It is important to understand your risk of falling. Talk with your health care provider about your risk and what you can do to lower it. If you do have a serious fall, make sure to tell your provider. Falling once raises your risk of falling again. How can falls affect me? Serious injuries from falls are common. These include: Broken bones, such as hip fractures. Head injuries, such as traumatic brain injuries (TBI) or concussions. A fear of falling can cause you to avoid activities and stay at home. This can make your muscles weaker and raise your risk for a fall. What can increase my risk? There are a number of risk factors that increase your risk for falling. The more risk factors you have, the higher your risk of falling. Serious injuries from a fall happen  most often to people who are older than 85 years old. Teenagers and young adults ages 36-29 are also at higher risk. Common risk factors include: Weakness in the lower body. Being generally weak or confused due to long-term (chronic) illness. Dizziness or balance problems. Poor vision. Medicines that cause dizziness or drowsiness. These may include: Medicines for your blood pressure, heart, anxiety, insomnia, or swelling (edema). Pain medicines. Muscle relaxants. Other risk factors include: Drinking alcohol. Having had a fall in the past. Having foot pain or wearing improper footwear. Working at a dangerous job. Having any of the following in your home: Tripping hazards, such as floor clutter or loose rugs. Poor lighting. Pets. Having dementia or memory loss. What actions can I take to lower my risk of falling?     Physical activity Stay physically fit. Do strength and balance exercises. Consider taking a regular class to build strength and balance. Yoga and tai chi are good options. Vision Have your eyes checked every year and your prescription for glasses or contacts updated as needed. Shoes and walking aids Wear non-skid shoes. Wear shoes that have rubber soles and low heels. Do not wear high heels. Do not walk around the house in socks or slippers. Use a cane or walker as told by your provider. Home safety Attach secure railings on both sides of your stairs. Install grab bars for your bathtub, shower, and toilet. Use a non-skid mat in your bathtub or shower. Attach bath mats securely with double-sided, non-slip rug tape. Use good lighting in all rooms. Keep a flashlight near your bed. Make sure there is a clear path from your bed to the bathroom. Use night-lights. Do not use throw rugs. Make sure all carpeting is taped or tacked down securely. Remove all clutter from walkways and stairways, including extension cords. Repair uneven or broken steps and floors. Avoid  walking on icy or slippery surfaces. Walk on the grass instead of on icy or slick sidewalks. Use ice melter to get rid of ice on walkways in the winter. Use a cordless phone. Questions to ask your health care provider Can you help me check my risk for a fall? Do any of my medicines make me more likely to fall? Should I take a vitamin D  supplement? What exercises can I do to improve my strength and balance? Should I make an appointment to have my vision checked? Do I need a bone density test to check for weak bones (osteoporosis)? Would it help to use a cane or a walker? Where to find more information Centers for Disease  Control and Prevention, [employer]: TonerPromos.no Community-Based Fall Prevention Programs: TonerPromos.no [employer] on Aging: BaseRingTones[employer]pl Contact a health care provider if: You fall at home. You are afraid of falling at home. You feel weak, drowsy, or dizzy. This information is not intended to replace advice given to you by your health care provider. Make sure you discuss any questions you have with your health care provider. Document Revised: 09/14/2021 Document Reviewed: 09/14/2021 Elsevier Patient Education  2024 [employer].
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
## 2023-06-23 ENCOUNTER — Other Ambulatory Visit (HOSPITAL_COMMUNITY)

## 2023-07-04 ENCOUNTER — Telehealth: Payer: Self-pay

## 2023-07-04 NOTE — Telephone Encounter (Signed)
 Copied from CRM [id number]. Topic: Clinical - Request for Lab/Test Order >> Jul 04, 2023  3:34 PM Ivette P wrote: Reason for CRM: Pt called in to see if labs were needed for upcoming appt. 06/17 Did not see any notes showing needing blood work. Pt would like to know. Call back [phone] (cell)

## 2023-07-05 NOTE — Telephone Encounter (Signed)
 Per January OV note, labs are to be obtained. Pt aware

## 2023-07-06 DIAGNOSIS — I10 Essential (primary) hypertension: Secondary | ICD-10-CM | POA: Diagnosis not present

## 2023-07-06 DIAGNOSIS — E785 Hyperlipidemia, unspecified: Secondary | ICD-10-CM | POA: Diagnosis not present

## 2023-07-06 DIAGNOSIS — E559 Vitamin D deficiency, unspecified: Secondary | ICD-10-CM | POA: Diagnosis not present

## 2023-07-07 LAB — CBC
Hematocrit: 42.3 % (ref 34.0–46.6)
Hemoglobin: 13.6 g/dL (ref 11.1–15.9)
MCH: 29.8 pg (ref 26.6–33.0)
MCHC: 32.2 g/dL (ref 31.5–35.7)
MCV: 93 fL (ref 79–97)
Platelets: 405 10*3/uL (ref 150–450)
RBC: 4.57 x10E6/uL (ref 3.77–5.28)
RDW: 12.2 % (ref 11.7–15.4)
WBC: 6.1 10*3/uL (ref 3.4–10.8)

## 2023-07-07 LAB — CMP14+EGFR
ALT: 7 IU/L (ref 0–32)
AST: 10 IU/L (ref 0–40)
Albumin: 4.4 g/dL (ref 3.7–4.7)
Alkaline Phosphatase: 91 IU/L (ref 44–121)
BUN/Creatinine Ratio: 13 (ref 12–28)
BUN: 12 mg/dL (ref 8–27)
Bilirubin Total: 1.3 mg/dL — ABNORMAL HIGH (ref 0.0–1.2)
CO2: 20 mmol/L (ref 20–29)
Calcium: 9.6 mg/dL (ref 8.7–10.3)
Chloride: 104 mmol/L (ref 96–106)
Creatinine, Ser: 0.9 mg/dL (ref 0.57–1.00)
Globulin, Total: 3 g/dL (ref 1.5–4.5)
Glucose: 91 mg/dL (ref 70–99)
Potassium: 4.5 mmol/L (ref 3.5–5.2)
Sodium: 139 mmol/L (ref 134–144)
Total Protein: 7.4 g/dL (ref 6.0–8.5)
eGFR: 63 mL/min/{1.73_m2} (ref >59)

## 2023-07-07 LAB — LIPID PANEL
Chol/HDL Ratio: 2.6 ratio (ref 0.0–4.4)
Cholesterol, Total: 192 mg/dL (ref 100–199)
HDL: 75 mg/dL (ref >39)
LDL Chol Calc (NIH): 102 mg/dL — ABNORMAL HIGH (ref 0–99)
Triglycerides: 82 mg/dL (ref 0–149)
VLDL Cholesterol Cal: 15 mg/dL (ref 5–40)

## 2023-07-07 LAB — TSH: TSH: 1.59 u[IU]/mL (ref 0.450–4.500)

## 2023-07-07 LAB — VITAMIN D 25 HYDROXY (VIT D DEFICIENCY, FRACTURES): Vit D, 25-Hydroxy: 25.4 ng/mL — ABNORMAL LOW (ref 30.0–100.0)

## 2023-07-12 ENCOUNTER — Ambulatory Visit (INDEPENDENT_AMBULATORY_CARE_PROVIDER_SITE_OTHER): Payer: Medicare Other | Admitting: Family Medicine

## 2023-07-12 ENCOUNTER — Encounter: Payer: Self-pay | Admitting: Family Medicine

## 2023-07-12 VITALS — BP 130/80 | HR 68 | Resp 16 | Ht 62.5 in | Wt 150.1 lb

## 2023-07-12 DIAGNOSIS — I10 Essential (primary) hypertension: Secondary | ICD-10-CM

## 2023-07-12 DIAGNOSIS — E559 Vitamin D deficiency, unspecified: Secondary | ICD-10-CM | POA: Diagnosis not present

## 2023-07-12 DIAGNOSIS — R634 Abnormal weight loss: Secondary | ICD-10-CM | POA: Diagnosis not present

## 2023-07-12 DIAGNOSIS — M8580 Other specified disorders of bone density and structure, unspecified site: Secondary | ICD-10-CM | POA: Diagnosis not present

## 2023-07-12 DIAGNOSIS — Z1231 Encounter for screening mammogram for malignant neoplasm of breast: Secondary | ICD-10-CM | POA: Insufficient documentation

## 2023-07-12 DIAGNOSIS — E785 Hyperlipidemia, unspecified: Secondary | ICD-10-CM

## 2023-07-12 MED ORDER — PRAVASTATIN SODIUM 20 MG PO TABS: ORAL_TABLET | ORAL | 1 refills | Status: DC

## 2023-07-12 MED ORDER — AMLODIPINE BESYLATE 5 MG PO TABS: 5.0000 mg | ORAL_TABLET | Freq: Every day | ORAL | 1 refills | Status: DC

## 2023-07-12 MED ORDER — SPIRONOLACTONE 25 MG PO TABS: 25.0000 mg | ORAL_TABLET | Freq: Every day | ORAL | 1 refills | Status: DC

## 2023-07-12 NOTE — Assessment & Plan Note (Signed)
 Rept dexa past due Continue supplement and regular weight bearing activity

## 2023-07-12 NOTE — Assessment & Plan Note (Signed)
 14 pound weight loss in 5 months, reports reduced intake due to anxiety. Will intentionall eat at regular interavls with increased intake to maintain current weight . Re eval in 8 to 12 weeks, sooner if needed

## 2023-07-12 NOTE — Progress Notes (Signed)
 Allison [name]     MRN: [id number]      DOB: 1940-02-15  Chief Complaint  Patient presents with   Hypertension    5 month follow up     HPI Ms. Longstreth is here for follow up and re-evaluation of chronic medical conditions, medication management and review of any available recent lab and radiology data.  Preventive health is updated, specifically  Cancer screening and Immunization.   Questions or concerns regarding consultations or procedures which the PT has had in the interim are  addressed. Concern re excessive weight loss, due prinmarily to reduced intake, worrying over ill son x 6 months, still very disabled, Denies change in BM  or abdominal pain. Cancer screening is UTD, recent labs rre thyroid  function and blood sugar are normal ROS Denies recent fever or chills. Denies sinus pressure, nasal congestion, ear pain or sore throat. Denies chest congestion, productive cough or wheezing. Denies chest pains, palpitations and leg swelling Denies abdominal pain, nausea, vomiting,diarrhea or constipation.   Denies dysuria, frequency, hesitancy or incontinence. Denies joint pain, swelling and limitation in mobility. Denies headaches, seizures, numbness, or tingling.  Denies skin break down or rash.   PE  BP 130/80   Pulse 68   Resp 16   Ht 5' 2.5 (1.588 m)   Wt 150 lb 1.3 oz (68.1 kg)   SpO2 96%   BMI 27.01 kg/m   Patient alert and oriented and in no cardiopulmonary distress.  HEENT: No facial asymmetry, EOMI,     Neck supple .  Chest: Clear to auscultation bilaterally.  CVS: S1, S2 no murmurs, no S3.Regular rate.  ABD: Soft non tender.   Ext: No edema  MS: Adequate ROM spine, shoulders, hips and knees.  Skin: Intact, no ulcerations or rash noted.  Psych: Good eye contact, normal affect. Memory intact not anxious or depressed appearing.  CNS: CN 2-12 intact, power,  normal throughout.no focal deficits noted.   Assessment & Plan  Essential  hypertension Controlled, no change in medication DASH diet and commitment to daily physical activity for a minimum of 30 minutes discussed and encouraged, as a part of hypertension management. The importance of attaining a healthy weight is also discussed.     07/12/2023   10:00 AM 06/15/2023   10:30 AM 02/10/2023   10:12 AM 08/06/2022   10:17 AM 06/07/2022    9:13 AM 02/03/2022   10:42 AM 02/03/2022    9:54 AM  BP/Weight  Systolic BP 130 -- 98 132 126 562 126  Diastolic BP 80 -- 64 87 84 84 88  Wt. (Lbs) 150.08 168 164.12 171.04 176  170.04  BMI 27.01 kg/m2 30.24 kg/m2 30.02 kg/m2 31.28 kg/m2 32.19 kg/m2  31.1 kg/m2       Hyperlipidemia LDL goal <100 Hyperlipidemia:Low fat diet discussed and encouraged.   Lipid Panel  Lab Results  Component Value Date   CHOL 192 07/06/2023   HDL 75 07/06/2023   LDLCALC 102 (H) 07/06/2023   TRIG 82 07/06/2023   CHOLHDL 2.6 07/06/2023     No med change  Vitamin D  deficiency Under corrected, needs to commit to daily vit D, 1000 to 2000 units  Osteopenia Rept dexa past due Continue supplement and regular weight bearing activity  Excessive weight loss 14 pound weight loss in 5 months, reports reduced intake due to anxiety. Will intentionall eat at regular interavls with increased intake to maintain current weight . Re eval in 8 to 12 weeks, sooner if needed

## 2023-07-12 NOTE — Patient Instructions (Addendum)
 F/u in10 weeks, re evaluate weight, call if you need me sooner   Pls schedule dexa and mammogram at checkout  Need to increase amount of food you eat and eat intentionally at regular times, NO more weight loss  Thanks for choosing [facility], we consider it a privelige to serve you.

## 2023-07-12 NOTE — Assessment & Plan Note (Signed)
 Under corrected, needs to commit to daily vit D, 1000 to 2000 units

## 2023-07-12 NOTE — Assessment & Plan Note (Signed)
 Hyperlipidemia:Low fat diet discussed and encouraged.   Lipid Panel  Lab Results  Component Value Date   CHOL 192 07/06/2023   HDL 75 07/06/2023   LDLCALC 102 (H) 07/06/2023   TRIG 82 07/06/2023   CHOLHDL 2.6 07/06/2023     No med change

## 2023-07-12 NOTE — Assessment & Plan Note (Signed)
 Controlled, no change in medication DASH diet and commitment to daily physical activity for a minimum of 30 minutes discussed and encouraged, as a part of hypertension management. The importance of attaining a healthy weight is also discussed.     07/12/2023   10:00 AM 06/15/2023   10:30 AM 02/10/2023   10:12 AM 08/06/2022   10:17 AM 06/07/2022    9:13 AM 02/03/2022   10:42 AM 02/03/2022    9:54 AM  BP/Weight  Systolic BP 130 -- 98 132 126 098 126  Diastolic BP 80 -- 64 87 84 84 88  Wt. (Lbs) 150.08 168 164.12 171.04 176  170.04  BMI 27.01 kg/m2 30.24 kg/m2 30.02 kg/m2 31.28 kg/m2 32.19 kg/m2  31.1 kg/m2

## 2023-09-20 ENCOUNTER — Encounter: Payer: Self-pay | Admitting: Family Medicine

## 2023-09-20 ENCOUNTER — Ambulatory Visit (INDEPENDENT_AMBULATORY_CARE_PROVIDER_SITE_OTHER): Admitting: Family Medicine

## 2023-09-20 VITALS — BP 127/80 | HR 69 | Resp 16 | Ht 62.5 in | Wt 155.0 lb

## 2023-09-20 DIAGNOSIS — R634 Abnormal weight loss: Secondary | ICD-10-CM

## 2023-09-20 DIAGNOSIS — I10 Essential (primary) hypertension: Secondary | ICD-10-CM

## 2023-09-20 DIAGNOSIS — E559 Vitamin D deficiency, unspecified: Secondary | ICD-10-CM

## 2023-09-20 DIAGNOSIS — E785 Hyperlipidemia, unspecified: Secondary | ICD-10-CM | POA: Diagnosis not present

## 2023-09-20 NOTE — Progress Notes (Signed)
   Allison GERDEMAN     MRN: [id number]      DOB: 18-Nov-1940  Chief Complaint  Patient presents with   Hypertension    Follow up     HPI [name] is here for follow up and re-evaluation of chronic medical conditions, medication management and review of any available recent lab and radiology data.  Preventive health is updated, specifically  Cancer screening and Immunization.   Questions or concerns regarding consultations or procedures which the PT has had in the interim are  addressed. The PT denies any adverse reactions to current medications since the last visit.  There are no new concerns.  There are no specific complaints   ROS Denies recent fever or chills. Denies sinus pressure, nasal congestion, ear pain or sore throat. Denies chest congestion, productive cough or wheezing. Denies chest pains, palpitations and leg swelling Denies abdominal pain, nausea, vomiting,diarrhea or constipation.   Denies dysuria, frequency, hesitancy or incontinence. Denies  uncontrolled joint pain, swelling and limitation in mobility. Denies headaches, seizures, numbness, or tingling. Denies depression, anxiety or insomnia. Denies skin break down or rash.   PE  BP 127/80   Pulse 69   Resp 16   Ht 5' 2.5 (1.588 m)   Wt 155 lb 0.6 oz (70.3 kg)   SpO2 100%   BMI 27.91 kg/m   Patient alert and oriented and in no cardiopulmonary distress.  HEENT: No facial asymmetry, EOMI,     Neck supple .  Chest: Clear to auscultation bilaterally.  CVS: S1, S2 no murmurs, no S3.Regular rate.  ABD: Soft non tender.   Ext: No edema  MS: Adequate ROM spine, shoulders, hips and knees.  Skin: Intact, no ulcerations or rash noted.  Psych: Good eye contact, normal affect. Memory intact not anxious or depressed appearing.  CNS: CN 2-12 intact, power,  normal throughout.no focal deficits noted.   Assessment & Plan  Vitamin D  deficiency Updated lab needed at/ before next  visit.   Hyperlipidemia LDL goal <100 Hyperlipidemia:Low fat diet discussed and encouraged.   Lipid Panel  Lab Results  Component Value Date   CHOL 192 07/06/2023   HDL 75 07/06/2023   LDLCALC 102 (H) 07/06/2023   TRIG 82 07/06/2023   CHOLHDL 2.6 07/06/2023     Updated lab needed at/ before next visit.   Essential hypertension Controlled, no change in medication DASH diet and commitment to daily physical activity for a minimum of 30 minutes discussed and encouraged, as a part of hypertension management. The importance of attaining a healthy weight is also discussed.     09/20/2023   10:27 AM 07/12/2023   10:00 AM 06/15/2023   10:30 AM 02/10/2023   10:12 AM 08/06/2022   10:17 AM 06/07/2022    9:13 AM 02/03/2022   10:42 AM  BP/Weight  Systolic BP 127 130 -- 98 132 873 126  Diastolic BP 80 80 -- 64 87 84 84  Wt. (Lbs) 155.04 150.08 168 164.12 171.04 176   BMI 27.91 kg/m2 27.01 kg/m2 30.24 kg/m2 30.02 kg/m2 31.28 kg/m2 32.19 kg/m2        Excessive weight loss Resolved, has regained 5 pounds in past 2 months

## 2023-09-20 NOTE — Assessment & Plan Note (Signed)
 Updated lab needed at/ before next visit.

## 2023-09-20 NOTE — Patient Instructions (Addendum)
 Annual exam in January  Pls schedule for flu vaccine at checkout  Fasting lipid, cmp and eGFR and vit D 1 week before January appointment  Keep up great health habits and no changes in current medication  Pls get covid vaccine  in the next 4 to 6 weeks at your pharmacy  It is important that you exercise regularly at least 30 minutes 5 times a week. If you develop chest pain, have severe difficulty breathing, or feel very tired, stop exercising immediately and seek medical attention   Weight is healthy, keep on eating as you have been  All the best for 83 years in October!! Careful not to fall  Thanks for choosing [facility], we consider it a privelige to serve you.

## 2023-09-20 NOTE — Assessment & Plan Note (Signed)
 Controlled, no change in medication DASH diet and commitment to daily physical activity for a minimum of 30 minutes discussed and encouraged, as a part of hypertension management. The importance of attaining a healthy weight is also discussed.     09/20/2023   10:27 AM 07/12/2023   10:00 AM 06/15/2023   10:30 AM 02/10/2023   10:12 AM 08/06/2022   10:17 AM 06/07/2022    9:13 AM 02/03/2022   10:42 AM  BP/Weight  Systolic BP 127 130 -- 98 132 873 126  Diastolic BP 80 80 -- 64 87 84 84  Wt. (Lbs) 155.04 150.08 168 164.12 171.04 176   BMI 27.91 kg/m2 27.01 kg/m2 30.24 kg/m2 30.02 kg/m2 31.28 kg/m2 32.19 kg/m2

## 2023-09-20 NOTE — Assessment & Plan Note (Signed)
 Hyperlipidemia:Low fat diet discussed and encouraged.   Lipid Panel  Lab Results  Component Value Date   CHOL 192 07/06/2023   HDL 75 07/06/2023   LDLCALC 102 (H) 07/06/2023   TRIG 82 07/06/2023   CHOLHDL 2.6 07/06/2023     Updated lab needed at/ before next visit.

## 2023-09-20 NOTE — Assessment & Plan Note (Signed)
 Resolved, has regained 5 pounds in past 2 months

## 2023-10-17 ENCOUNTER — Ambulatory Visit (INDEPENDENT_AMBULATORY_CARE_PROVIDER_SITE_OTHER)

## 2023-10-17 DIAGNOSIS — Z23 Encounter for immunization: Secondary | ICD-10-CM

## 2023-10-17 NOTE — Progress Notes (Signed)
 Patient is in office today for a nurse visit for Immunization. Patient Injection was given in the  Right deltoid. Patient tolerated injection well.

## 2023-10-18 ENCOUNTER — Ambulatory Visit

## 2023-10-31 ENCOUNTER — Ambulatory Visit (HOSPITAL_COMMUNITY)
Admission: RE | Admit: 2023-10-31 | Discharge: 2023-10-31 | Disposition: A | Discharge status: Home / Self Care | Source: Ambulatory Visit | Attending: Family Medicine | Admitting: Family Medicine

## 2023-10-31 DIAGNOSIS — Z1231 Encounter for screening mammogram for malignant neoplasm of breast: Secondary | ICD-10-CM | POA: Insufficient documentation

## 2023-10-31 DIAGNOSIS — Z780 Asymptomatic menopausal state: Secondary | ICD-10-CM | POA: Insufficient documentation

## 2023-10-31 DIAGNOSIS — M8589 Other specified disorders of bone density and structure, multiple sites: Secondary | ICD-10-CM | POA: Diagnosis not present

## 2023-11-11 ENCOUNTER — Ambulatory Visit: Payer: Self-pay

## 2023-11-11 NOTE — Telephone Encounter (Signed)
Mailed normal results to patient

## 2023-12-19 ENCOUNTER — Ambulatory Visit: Payer: Self-pay | Admitting: Family Medicine

## 2024-01-02 ENCOUNTER — Other Ambulatory Visit: Payer: Self-pay | Admitting: Family Medicine

## 2024-02-08 ENCOUNTER — Telehealth: Payer: Self-pay

## 2024-02-08 NOTE — Telephone Encounter (Signed)
 Copied from CRM [id number]. Topic: Clinical - Lab/Test Results >> Feb 08, 2024 11:28 AM Victoria B wrote: Reason for CRM: patient has physical on 01/26 and says was told to have labs done a week before this appt

## 2024-02-08 NOTE — Telephone Encounter (Signed)
 Lvm informing labs had been ordered since august

## 2024-02-09 ENCOUNTER — Other Ambulatory Visit: Payer: Self-pay | Admitting: Family Medicine

## 2024-02-10 ENCOUNTER — Ambulatory Visit: Payer: Self-pay | Admitting: Family Medicine

## 2024-02-10 LAB — CMP14+EGFR
ALT: 8 IU/L (ref 0–32)
AST: 12 IU/L (ref 0–40)
Albumin: 4.3 g/dL (ref 3.7–4.7)
Alkaline Phosphatase: 81 IU/L (ref 48–129)
BUN/Creatinine Ratio: 13 (ref 12–28)
BUN: 13 mg/dL (ref 8–27)
Bilirubin Total: 1.3 mg/dL — ABNORMAL HIGH (ref 0.0–1.2)
CO2: 23 mmol/L (ref 20–29)
Calcium: 9.6 mg/dL (ref 8.7–10.3)
Chloride: 103 mmol/L (ref 96–106)
Creatinine, Ser: 0.98 mg/dL (ref 0.57–1.00)
Globulin, Total: 3.1 g/dL (ref 1.5–4.5)
Glucose: 82 mg/dL (ref 70–99)
Potassium: 4.4 mmol/L (ref 3.5–5.2)
Sodium: 142 mmol/L (ref 134–144)
Total Protein: 7.4 g/dL (ref 6.0–8.5)
eGFR: 57 mL/min/1.73 — ABNORMAL LOW

## 2024-02-10 LAB — VITAMIN D 25 HYDROXY (VIT D DEFICIENCY, FRACTURES): Vit D, 25-Hydroxy: 21.1 ng/mL — ABNORMAL LOW (ref 30.0–100.0)

## 2024-02-10 LAB — LIPID PANEL
Chol/HDL Ratio: 2.2 ratio (ref 0.0–4.4)
Cholesterol, Total: 196 mg/dL (ref 100–199)
HDL: 88 mg/dL
LDL Chol Calc (NIH): 94 mg/dL (ref 0–99)
Triglycerides: 75 mg/dL (ref 0–149)
VLDL Cholesterol Cal: 14 mg/dL (ref 5–40)

## 2024-02-16 ENCOUNTER — Ambulatory Visit: Admitting: Family Medicine

## 2024-02-16 ENCOUNTER — Encounter: Payer: Self-pay | Admitting: Family Medicine

## 2024-02-16 VITALS — BP 114/80 | HR 75 | Resp 16 | Ht 62.5 in | Wt 152.0 lb

## 2024-02-16 DIAGNOSIS — Z0001 Encounter for general adult medical examination with abnormal findings: Secondary | ICD-10-CM

## 2024-02-16 DIAGNOSIS — I10 Essential (primary) hypertension: Secondary | ICD-10-CM

## 2024-02-16 DIAGNOSIS — E785 Hyperlipidemia, unspecified: Secondary | ICD-10-CM

## 2024-02-16 MED ORDER — VITAMIN D (ERGOCALCIFEROL) 1.25 MG (50000 UNIT) PO CAPS: 50000.0000 [IU] | ORAL_CAPSULE | ORAL | 2 refills | Status: AC

## 2024-02-16 NOTE — Progress Notes (Signed)
" ° ° °  Allison [name]     MRN: [id number]      DOB: 1940/10/20  Chief Complaint  Patient presents with   Annual Exam    Cpe     HPI: Patient is in for annual physical exam. No other health concerns are expressed or addressed at the visit. Recent labs,  are reviewed. Immunization is reviewed , and  updated if needed.   PE: BP 114/80   Pulse 75   Resp 16   Ht 5' 2.5 (1.588 m)   Wt 152 lb (68.9 kg)   SpO2 98%   BMI 27.36 kg/m   Pleasant  female, alert and oriented x 3, in no cardio-pulmonary distress. Afebrile. HEENT No facial trauma or asymetry. Sinuses non tender.  Extra occullar muscles intact.. External ears normal, . Neck: supple, no adenopathy,JVD or thyromegaly.No bruits.  Chest: Clear to ascultation bilaterally.No crackles or wheezes. Non tender to palpation  Cardiovascular system; Heart sounds normal,  S1 and  S2 ,no S3.  No murmur, or thrill. Apical beat not displaced Peripheral pulses normal.  Abdomen: Soft, non tender, no organomegaly or masses.    Musculoskeletal exam: Decreased though adequate  ROM of spine, hips , shoulders and knees. No deformity ,swelling or crepitus noted. No muscle wasting or atrophy.   Neurologic: Cranial nerves 2 to 12 intact. Power, tone ,sensation and reflexes normal throughout. No disturbance in gait. No tremor.  Skin: Intact, no ulceration, erythema , scaling or rash noted. Pigmentation normal throughout  Psych; Normal mood and affect. Judgement and concentration normal   Assessment & Plan:  Encounter for Medicare annual examination with abnormal findings Annual exam as documented. Counseling done  re healthy lifestyle involving commitment to 150 minutes exercise per week, heart healthy diet, and attaining healthy weight.The importance of adequate sleep also discussed. Regular seat belt use and home safety, is also discussed. Changes in health habits are decided on by the patient with goals and time frames   set for achieving them. Immunization and cancer screening needs are specifically addressed at this visit.  "

## 2024-02-16 NOTE — Patient Instructions (Addendum)
 F/U end August, flu vaccine at visit  Increase  calcium  to TWO every day  Start Vit D capsule ONE capsule ONCE weekly     CBC, TSH, cmp and EGFR three to five days before August appt  Handicap sticker today  Careful not to fall  Thanks for choosing [facility], we consider it a privelige to serve you.

## 2024-02-18 NOTE — Assessment & Plan Note (Signed)

## 2024-06-19 ENCOUNTER — Ambulatory Visit

## 2024-08-28 ENCOUNTER — Ambulatory Visit: Payer: Self-pay | Admitting: Family Medicine
